# Patient Record
Sex: Female | Born: 1960 | Race: Black or African American | Hispanic: No | Marital: Married | Smoking: Never smoker
Health system: Southern US, Community
[De-identification: ages and names within clinical notes are randomized; demographics above are authoritative.]

## PROBLEM LIST (undated history)

## (undated) DIAGNOSIS — E785 Hyperlipidemia, unspecified: Secondary | ICD-10-CM

## (undated) DIAGNOSIS — M1990 Unspecified osteoarthritis, unspecified site: Secondary | ICD-10-CM

## (undated) DIAGNOSIS — R739 Hyperglycemia, unspecified: Secondary | ICD-10-CM

## (undated) DIAGNOSIS — M722 Plantar fascial fibromatosis: Secondary | ICD-10-CM

## (undated) DIAGNOSIS — I10 Essential (primary) hypertension: Secondary | ICD-10-CM

## (undated) HISTORY — PX: OTHER SURGICAL HISTORY: SHX169

## (undated) HISTORY — PX: ABDOMINAL HYSTERECTOMY: SHX81

## (undated) HISTORY — DX: Unspecified osteoarthritis, unspecified site: M19.90

## (undated) HISTORY — DX: Hyperlipidemia, unspecified: E78.5

## (undated) HISTORY — PX: ENDOMETRIAL ABLATION: SHX621

## (undated) HISTORY — PX: STOMACH SURGERY: SHX791

## (undated) HISTORY — PX: UPPER GI ENDOSCOPY: SHX6162

## (undated) HISTORY — DX: Hyperglycemia, unspecified: R73.9

## (undated) HISTORY — DX: Essential (primary) hypertension: I10

## (undated) HISTORY — DX: Plantar fascial fibromatosis: M72.2

---

## 2004-07-31 ENCOUNTER — Ambulatory Visit: Payer: Self-pay | Admitting: Unknown Physician Specialty

## 2009-09-19 ENCOUNTER — Emergency Department: Payer: Self-pay | Admitting: Emergency Medicine

## 2010-07-22 ENCOUNTER — Ambulatory Visit: Payer: Self-pay | Admitting: Unknown Physician Specialty

## 2011-07-28 ENCOUNTER — Ambulatory Visit: Payer: Self-pay | Admitting: Internal Medicine

## 2011-09-27 ENCOUNTER — Emergency Department: Payer: Self-pay | Admitting: Emergency Medicine

## 2012-01-20 ENCOUNTER — Ambulatory Visit: Payer: Self-pay | Admitting: Internal Medicine

## 2012-08-02 ENCOUNTER — Ambulatory Visit: Payer: Self-pay | Admitting: Internal Medicine

## 2012-10-10 ENCOUNTER — Ambulatory Visit: Payer: Self-pay

## 2012-11-25 ENCOUNTER — Ambulatory Visit: Payer: Self-pay | Admitting: Unknown Physician Specialty

## 2012-12-19 ENCOUNTER — Ambulatory Visit: Payer: Self-pay | Admitting: Physical Medicine and Rehabilitation

## 2013-04-11 DIAGNOSIS — M545 Low back pain, unspecified: Secondary | ICD-10-CM | POA: Insufficient documentation

## 2013-04-11 DIAGNOSIS — M533 Sacrococcygeal disorders, not elsewhere classified: Secondary | ICD-10-CM | POA: Insufficient documentation

## 2013-08-09 ENCOUNTER — Ambulatory Visit: Payer: Self-pay | Admitting: Internal Medicine

## 2013-11-17 ENCOUNTER — Ambulatory Visit: Payer: Self-pay | Admitting: Internal Medicine

## 2014-04-07 DIAGNOSIS — R739 Hyperglycemia, unspecified: Secondary | ICD-10-CM | POA: Insufficient documentation

## 2014-04-07 DIAGNOSIS — M722 Plantar fascial fibromatosis: Secondary | ICD-10-CM | POA: Insufficient documentation

## 2014-04-07 DIAGNOSIS — I10 Essential (primary) hypertension: Secondary | ICD-10-CM | POA: Insufficient documentation

## 2014-04-07 DIAGNOSIS — K279 Peptic ulcer, site unspecified, unspecified as acute or chronic, without hemorrhage or perforation: Secondary | ICD-10-CM | POA: Insufficient documentation

## 2014-04-07 DIAGNOSIS — E785 Hyperlipidemia, unspecified: Secondary | ICD-10-CM | POA: Insufficient documentation

## 2014-06-19 ENCOUNTER — Ambulatory Visit: Payer: Self-pay | Admitting: Internal Medicine

## 2014-09-13 ENCOUNTER — Ambulatory Visit: Payer: Self-pay | Admitting: Internal Medicine

## 2015-05-21 ENCOUNTER — Encounter: Payer: Self-pay | Admitting: Vascular Surgery

## 2015-05-21 ENCOUNTER — Other Ambulatory Visit: Payer: Self-pay

## 2015-05-21 DIAGNOSIS — I872 Venous insufficiency (chronic) (peripheral): Secondary | ICD-10-CM

## 2015-05-31 ENCOUNTER — Encounter: Payer: Self-pay | Admitting: Vascular Surgery

## 2015-06-03 ENCOUNTER — Ambulatory Visit (HOSPITAL_COMMUNITY)
Admission: RE | Admit: 2015-06-03 | Discharge: 2015-06-03 | Disposition: A | Discharge status: Home / Self Care | Payer: 59 | Source: Ambulatory Visit | Attending: Vascular Surgery | Admitting: Vascular Surgery

## 2015-06-03 ENCOUNTER — Ambulatory Visit (INDEPENDENT_AMBULATORY_CARE_PROVIDER_SITE_OTHER): Payer: 59 | Admitting: Vascular Surgery

## 2015-06-03 ENCOUNTER — Encounter: Payer: Self-pay | Admitting: Vascular Surgery

## 2015-06-03 VITALS — BP 137/92 | HR 76 | Temp 98.0°F | Resp 16 | Ht 66.0 in | Wt 189.0 lb

## 2015-06-03 DIAGNOSIS — M79604 Pain in right leg: Secondary | ICD-10-CM | POA: Diagnosis not present

## 2015-06-03 DIAGNOSIS — M79605 Pain in left leg: Secondary | ICD-10-CM | POA: Diagnosis not present

## 2015-06-03 DIAGNOSIS — I872 Venous insufficiency (chronic) (peripheral): Secondary | ICD-10-CM | POA: Diagnosis not present

## 2015-06-03 NOTE — Progress Notes (Signed)
Subjective:     Patient ID: Sarah Savage, female   DOB: [birth year], 54 y.o.   MRN: [id number]  HPI this 54 year old female was referred by Dr. Doy Hutching for evaluation of bilateral leg pain. Pain seems more significant on the right side and begins below the knee. This happens a lot at night when she is in bed. She will develop numbness in the toes of both feet. She has no history of trauma, DVT, thrombophlebitis, stasis ulcers, swelling, bleeding varicosities, or claudication. She is able to ambulate at least 1 mile. She states her legs feel better when she is walking. She did have surgery for a bone spur in her right heel a few years ago. She then developed some back problems which were treated eventually with physical therapy and these resolved. She has no definite history of lumbar disc disease. Symptoms of been present for about 2 years and gradually getting worse.  Past Medical History  Diagnosis Date  . Hypertension   . Hyperlipidemia   . Hyperglycemia   . Plantar fasciitis     History  Substance Use Topics  . Smoking status: Never Smoker   . Smokeless tobacco: Not on file  . Alcohol Use: No    Family History  Problem Relation Age of Onset  . Hypertension Mother   . Dementia Mother   . Hypertension Father   . Arthritis Father   . Stroke Brother   . Hypertension Brother   . Diabetes Brother   . Cancer Maternal Grandmother     Breast    Allergies  Allergen Reactions  . Penicillins     "I gives me a yeast infection"     Current outpatient prescriptions:  .  naproxen (NAPROSYN) 500 MG tablet, Take 500 mg by mouth 2 (two) times daily with a meal. , Disp: , Rfl:  .  phentermine 37.5 MG capsule, Take 37.5 mg by mouth every morning., Disp: , Rfl:  .  polyethylene glycol powder (GLYCOLAX/MIRALAX) powder, Take 0.5 Containers by mouth daily. , Disp: , Rfl:  .  propranolol ER (INDERAL LA) 80 MG 24 hr capsule, TAKE ONE CAPSULE BY MOUTH ONCE A DAY, Disp: , Rfl:  .   amLODipine (NORVASC) 2.5 MG tablet, Take 2.5 mg by mouth daily. , Disp: , Rfl:  .  fluticasone (FLONASE) 50 MCG/ACT nasal spray, Place 2 sprays into both nostrils daily. , Disp: , Rfl:  .  terbinafine (LAMISIL) 250 MG tablet, Take 250 mg by mouth daily. , Disp: , Rfl:   Filed Vitals:   06/03/15 1010  BP: 137/92  Pulse: 76  Temp: 98 F (36.7 C)  Resp: 16  Height: 5\' 6"  (1.676 m)  Weight: 189 lb (85.73 kg)  SpO2: 100%    Body mass index is 30.52 kg/(m^2).           Review of Systems denies chest pain, dyspnea on exertion, PND, orthopnea, claudication. Does have history of bone spur right heel required surgery. Also history of plantar fasciitis, hypertension and hyperlipidemia. Other systems negative and complete review of systems     Objective:   Physical Exam BP 137/92 mmHg  Pulse 76  Temp(Src) 98 F (36.7 C)  Resp 16  Ht 5\' 6"  (1.676 m)  Wt 189 lb (85.73 kg)  BMI 30.52 kg/m2  SpO2 100%  Gen.-alert and oriented x3 in no apparent distress HEENT normal for age Lungs no rhonchi or wheezing Cardiovascular regular rhythm no murmurs carotid pulses 3+ palpable no bruits audible Abdomen  soft nontender no palpable masses Musculoskeletal free of  major deformities Skin clear -no rashes Neurologic normal Lower extremities 3+ femoral and dorsalis pedis pulses palpable bilaterally with no edema  Today I ordered a venous duplex exam both legs which I reviewed and interpreted. There is no DVT, there is no deep or superficial venous reflux. It is a normal study.       Assessment:     Bilateral leg pain with numbness in the feet which occurs mainly at rest-etiology unknown No evidence of arterial or venous insufficiency and no need for further vascular workup Symptoms may be more consistent with nerve compression syndrome    Plan:     No need for further vascular workup Patient may benefit from physical therapy but I do not know the etiology of her pain and  numbness. Return to see me on when necessary basis

## 2015-06-11 ENCOUNTER — Encounter: Payer: Self-pay | Admitting: Internal Medicine

## 2015-09-05 ENCOUNTER — Encounter: Payer: Self-pay | Admitting: Obstetrics and Gynecology

## 2015-09-05 ENCOUNTER — Ambulatory Visit (INDEPENDENT_AMBULATORY_CARE_PROVIDER_SITE_OTHER): Payer: 59 | Admitting: Obstetrics and Gynecology

## 2015-09-05 VITALS — BP 122/87 | HR 72 | Wt 191.2 lb

## 2015-09-05 DIAGNOSIS — J40 Bronchitis, not specified as acute or chronic: Secondary | ICD-10-CM

## 2015-09-05 DIAGNOSIS — R87610 Atypical squamous cells of undetermined significance on cytologic smear of cervix (ASC-US): Secondary | ICD-10-CM | POA: Diagnosis not present

## 2015-09-05 MED ORDER — AZITHROMYCIN 250 MG PO TABS: ORAL_TABLET | ORAL | Status: DC

## 2015-09-05 MED ORDER — ESTROGENS, CONJUGATED 0.625 MG/GM VA CREA: 1.0000 | TOPICAL_CREAM | Freq: Every day | VAGINAL | Status: DC

## 2015-09-05 NOTE — Patient Instructions (Signed)
1.  Pap/HPV testing today. 2.  Zithromax Z-Pak prescribed for bronchitis. 3.  Return in 6 months for repeat Pap smear testing. 4.  If Pap smear is abnormal, patient will be contacted for further management planning

## 2015-09-05 NOTE — Progress Notes (Signed)
Patient ID: Sarah Savage, female   DOB: 03-14-1961, 54 y.o.   MRN: [id number]  Chief complaint: 1.  Repeat Pap. 2.  URI symptoms.  Patient presents for 6 month interval.  Colposcopy due to previous history of abnormal Pap smear' 02/05/2015- neg/pos 02/2015 colpo- ecc-  neg  6 month pap today  Patient had a sore throat, congestion and cough develop in the past week.  She is a primary care several days ago with negative evaluation.  Sore throat symptoms eased up, but chronic dry Non- productive cough Persists.  Patient denies fevers.  Past medical history, past surgical history, past problem list, medications, and allergies are reviewed.  OBJECTIVE: BP 122/87 mmHg  Pulse 72  Wt 191 lb 3.2 oz (86.728 kg) Pleasant estimated female in no acute distress. HEENT exam: Oropharynx clear; no exudates or erythema; no external ear tenderness. Neck: Supple without adenopathy or thyromegaly. Lungs: Clear. Heart: Regular rate and rhythm. Pelvic exam: External genitalia-normal BUS-normal. Vagina-white milky discharge; no lesions. Cervix-stenotic; no lesions Uterus-not examined.  IMPRESSION: 1.  History of ASCUS/positive Pap with negative colposcopy and negative ECC; repeat Pap needed today. 2.  Bronchitis.  PLAN: 1.  Pap/HPV today. 2.  Z-Pak. 3.  Return in 6 months for repeat Pap smear or sooner if abnormality is identified on today's Pap.  Alanda Slim Defrancesco  Note: This dictation was prepared with Dragon dictation along with smaller Company secretary. Any transcriptional errors that result from this process are unintentional.

## 2015-09-13 LAB — PAP IG AND HPV HIGH-RISK
HPV, high-risk: NEGATIVE
PAP Smear Comment: 0

## 2015-10-22 ENCOUNTER — Other Ambulatory Visit: Payer: Self-pay | Admitting: Internal Medicine

## 2015-10-22 DIAGNOSIS — Z1231 Encounter for screening mammogram for malignant neoplasm of breast: Secondary | ICD-10-CM

## 2015-10-23 ENCOUNTER — Ambulatory Visit
Admission: RE | Admit: 2015-10-23 | Discharge: 2015-10-23 | Disposition: A | Discharge status: Home / Self Care | Payer: 59 | Source: Ambulatory Visit | Attending: Internal Medicine | Admitting: Internal Medicine

## 2015-10-23 DIAGNOSIS — Z1231 Encounter for screening mammogram for malignant neoplasm of breast: Secondary | ICD-10-CM | POA: Insufficient documentation

## 2015-11-18 ENCOUNTER — Other Ambulatory Visit: Payer: Self-pay | Admitting: Internal Medicine

## 2015-11-18 DIAGNOSIS — M25562 Pain in left knee: Secondary | ICD-10-CM

## 2015-12-09 ENCOUNTER — Ambulatory Visit: Payer: 59

## 2016-02-11 ENCOUNTER — Ambulatory Visit (INDEPENDENT_AMBULATORY_CARE_PROVIDER_SITE_OTHER): Payer: 59 | Admitting: Obstetrics and Gynecology

## 2016-02-11 ENCOUNTER — Encounter: Payer: Self-pay | Admitting: Obstetrics and Gynecology

## 2016-02-11 VITALS — BP 105/68 | HR 73 | Ht 65.0 in | Wt 183.1 lb

## 2016-02-11 DIAGNOSIS — R638 Other symptoms and signs concerning food and fluid intake: Secondary | ICD-10-CM | POA: Insufficient documentation

## 2016-02-11 DIAGNOSIS — R87610 Atypical squamous cells of undetermined significance on cytologic smear of cervix (ASC-US): Secondary | ICD-10-CM | POA: Diagnosis not present

## 2016-02-11 DIAGNOSIS — Z0000 Encounter for general adult medical examination without abnormal findings: Secondary | ICD-10-CM | POA: Diagnosis not present

## 2016-02-11 DIAGNOSIS — Z01419 Encounter for gynecological examination (general) (routine) without abnormal findings: Secondary | ICD-10-CM

## 2016-02-11 DIAGNOSIS — N952 Postmenopausal atrophic vaginitis: Secondary | ICD-10-CM | POA: Diagnosis not present

## 2016-02-11 NOTE — Patient Instructions (Signed)
1. Pap smear is done. 2. No mammogram is needed-completed in September 2016 3. Self breast exam monthly encouraged 4. Continue with estrogen cream intravaginal twice a week 5. Stool guaiac cards are done through primary care 6. Continue healthy eating and exercise 7. Return in 1 year for physical

## 2016-02-11 NOTE — Progress Notes (Signed)
Patient ID: Sarah Savage, female   DOB: 13-Oct-1961, 55 y.o.   MRN: [id number] ANNUAL PREVENTATIVE CARE GYN  ENCOUNTER NOTE  Subjective:       Sarah Savage is a 55 y.o. No obstetric history on file. female here for a routine annual gynecologic exam.  Current complaints: 1.  Want zpac or something for congestion   Patient reports congestion-type symptoms over the past 3-4 days. No significant fevers chills or sweats. No chronic productive cough. Sore throat was present several days ago.   Gynecologic History No LMP recorded. Patient has had an ablation. Contraception: ablation Last Pap: 08/2015 neg/neg. Results were: normal Last mammogram: 10/2015. Results were: normal  Obstetric History OB History  No data available    Past Medical History  Diagnosis Date  . Hypertension   . Hyperlipidemia   . Hyperglycemia   . Plantar fasciitis     Past Surgical History  Procedure Laterality Date  . Bunionectomy    . Endometrial ablation    . Stomach surgery      excision of ulcer    Current Outpatient Prescriptions on File Prior to Visit  Medication Sig Dispense Refill  . conjugated estrogens (PREMARIN) vaginal cream Place 1 Applicatorful vaginally daily. 1/2 gram twice weekly 42.5 g 12   No current facility-administered medications on file prior to visit.    Allergies  Allergen Reactions  . Penicillins     "I gives me a yeast infection"    Social History   Social History  . Marital Status: Married    Spouse Name: N/A  . Number of Children: N/A  . Years of Education: N/A   Occupational History  . Not on file.   Social History Main Topics  . Smoking status: Never Smoker   . Smokeless tobacco: Not on file  . Alcohol Use: No  . Drug Use: No  . Sexual Activity: Not Currently   Other Topics Concern  . Not on file   Social History Narrative    Family History  Problem Relation Age of Onset  . Hypertension Mother   . Dementia Mother   .  Hypertension Father   . Arthritis Father   . Stroke Brother   . Hypertension Brother   . Diabetes Brother   . Cancer Maternal Grandmother     Breast  . Breast cancer Maternal Grandmother 60    The following portions of the patient's history were reviewed and updated as appropriate: allergies, current medications, past family history, past medical history, past social history, past surgical history and problem list.  Review of Systems ROS Review of Systems - General ROS: negative for - chills, fatigue, fever, hot flashes, night sweats, weight gain or weight loss Psychological ROS: negative for - anxiety, decreased libido, depression, mood swings, physical abuse or sexual abuse Ophthalmic ROS: negative for - blurry vision, eye pain or loss of vision ENT ROS: negative for - headaches, hearing change, visual changes or vocal changes Allergy and Immunology ROS: negative for - hives, itchy/watery eyes or seasonal allergies Hematological and Lymphatic ROS: negative for - bleeding problems, bruising, swollen lymph nodes or weight loss Endocrine ROS: negative for - galactorrhea, hair pattern changes, hot flashes, malaise/lethargy, mood swings, palpitations, polydipsia/polyuria, skin changes, temperature intolerance or unexpected weight changes Breast ROS: negative for - new or changing breast lumps or nipple discharge Respiratory ROS: negative for - cough or shortness of breath Cardiovascular ROS: negative for - chest pain, irregular heartbeat, palpitations or shortness of breath Gastrointestinal  ROS: no abdominal pain, change in bowel habits, or black or bloody stools Genito-Urinary ROS: no dysuria, trouble voiding, or hematuria Musculoskeletal ROS: negative for - joint pain or joint stiffness Neurological ROS: negative for - bowel and bladder control changes Dermatological ROS: negative for rash and skin lesion changes   Objective:   BP 105/68 mmHg  Pulse 73  Ht 5\' 5"  (1.651 m)  Wt 183 lb  1.6 oz (83.054 kg)  BMI 30.47 kg/m2 CONSTITUTIONAL: Well-developed, well-nourished female in no acute distress.  PSYCHIATRIC: Normal mood and affect. Normal behavior. Normal judgment and thought content. [place]: Alert and oriented to person, place, and time. Normal muscle tone coordination. No cranial nerve deficit noted. HENT:  Normocephalic, atraumatic, External right and left ear normal. Oropharynx is clear and moist. No exudates. No sinus tenderness EYES: Conjunctivae and EOM are normal.. No scleral icterus.  NECK: Normal range of motion, supple, no masses.  Normal thyroid. Some shotty lymph nodes submandibular noted SKIN: Skin is warm and dry. No rash noted. Not diaphoretic. No erythema. No pallor. CARDIOVASCULAR: Normal heart rate noted, regular rhythm, no murmur. RESPIRATORY: Clear to auscultation bilaterally. Effort and breath sounds normal, no problems with respiration noted. BREASTS: Symmetric in size. No masses, skin changes, nipple drainage, or lymphadenopathy. ABDOMEN: Soft, normal bowel sounds, no distention noted.  No tenderness, rebound or guarding.  BLADDER: Normal PELVIC:  External Genitalia: Normal  BUS: Normal  Vagina: Moderate atrophy  Cervix: Normal; stenotic (unable to perform Cytobrush)  Uterus: Normal; retroverted, normal size and shape  Adnexa: Normal  RV: External hemorrhoids, No Rectal Masses and Normal Sphincter tone  MUSCULOSKELETAL: Normal range of motion. No tenderness.  No cyanosis, clubbing, or edema.  2+ distal pulses. LYMPHATIC: No Axillary, Supraclavicular, or Inguinal Adenopathy.    Assessment:   Annual gynecologic examination 55 y.o. Contraception: none bmi-30 Vaginal atrophy History of abnormal Pap smear with most recent Pap smear negative/negative Viral URI with congestion  Plan:  Pap: Pap, Reflex if ASCUS Mammogram: Not Indicated Stool Guaiac Testing:  thru pcp Labs: thru pcp Routine preventative health maintenance measures  emphasized: Exercise/Diet/Weight control, Tobacco Warnings and Alcohol/Substance use risks Continue estrogen cream twice a week intravaginal Sudafed, saltwater gargles, Cepacol lozenges, Robitussin as needed Return to [place], CMA  Brayton Mars, MD  Note: This dictation was prepared with Dragon dictation along with smaller phrase technology. Any transcriptional errors that result from this process are unintentional.

## 2016-02-13 LAB — PAP IG W/ RFLX HPV ASCU: PAP SMEAR COMMENT: 0

## 2016-03-04 ENCOUNTER — Ambulatory Visit: Payer: 59 | Admitting: Obstetrics and Gynecology

## 2016-03-16 ENCOUNTER — Other Ambulatory Visit: Payer: Self-pay

## 2016-03-16 ENCOUNTER — Telehealth: Payer: Self-pay | Admitting: Obstetrics and Gynecology

## 2016-03-16 MED ORDER — ESTROGENS, CONJUGATED 0.625 MG/GM VA CREA: 1.0000 | TOPICAL_CREAM | Freq: Every day | VAGINAL | Status: DC

## 2016-03-16 MED ORDER — ESTROGENS, CONJUGATED 0.625 MG/GM VA CREA: 0.2500 | TOPICAL_CREAM | Freq: Every day | VAGINAL | Status: DC

## 2016-03-16 NOTE — Telephone Encounter (Signed)
Sarah Savage called saying she's out of vaginal cream that was prescribed to her last year and is wondering if a Rx can be sent to her pharmacy for a refill. She'd like a phone call regarding this if needed.  Pt's ph# [phone] Thank you.

## 2016-03-16 NOTE — Telephone Encounter (Signed)
Pt aware.

## 2016-09-16 ENCOUNTER — Other Ambulatory Visit: Payer: Self-pay | Admitting: Internal Medicine

## 2016-09-16 DIAGNOSIS — Z1231 Encounter for screening mammogram for malignant neoplasm of breast: Secondary | ICD-10-CM

## 2016-10-27 ENCOUNTER — Ambulatory Visit
Admission: RE | Admit: 2016-10-27 | Discharge: 2016-10-27 | Disposition: A | Discharge status: Home / Self Care | Payer: 59 | Source: Ambulatory Visit | Attending: Internal Medicine | Admitting: Internal Medicine

## 2016-10-27 DIAGNOSIS — Z1231 Encounter for screening mammogram for malignant neoplasm of breast: Secondary | ICD-10-CM

## 2017-02-08 NOTE — Progress Notes (Signed)
Patient ID: Sarah Savage, female   DOB: January 10, 1961, 56 y.o.   MRN: [id number] ANNUAL PREVENTATIVE CARE GYN  ENCOUNTER NOTE  Subjective:       Sarah Savage is a 56 y.o. G4 66 P female here for a routine annual gynecologic exam.  Current complaints: 1.  Needs refill of premarin cream - has not used recently  Patient is currently not sexually active. She does not report any significant urinary urgency or frequency. She does have nocturia 2  Patient has history of chronic constipation Patient reports no new major health issues  Gynecologic History No LMP recorded. Patient has had an ablation. Contraception: ablation Last Pap: 02/11/2016 neg/neg. Results were: normal Last mammogram: 10/27/2016 birad 1. Results were: normal  Obstetric History OB History  No data available    Past Medical History:  Diagnosis Date  . Hyperglycemia   . Hyperlipidemia   . Hypertension   . Plantar fasciitis     Past Surgical History:  Procedure Laterality Date  . BUNIONECTOMY    . ENDOMETRIAL ABLATION    . STOMACH SURGERY     excision of ulcer    Current Outpatient Prescriptions on File Prior to Visit  Medication Sig Dispense Refill  . conjugated estrogens (PREMARIN) vaginal cream Place 1 Applicatorful vaginally daily. 1/2 gram twice weekly 42.5 g 12   No current facility-administered medications on file prior to visit.     Allergies  Allergen Reactions  . Penicillins     "I gives me a yeast infection"    Social History   Social History  . Marital status: Married    Spouse name: N/A  . Number of children: N/A  . Years of education: N/A   Occupational History  . Not on file.   Social History Main Topics  . Smoking status: Never Smoker  . Smokeless tobacco: Not on file  . Alcohol use No  . Drug use: No  . Sexual activity: Not Currently   Other Topics Concern  . Not on file   Social History Narrative  . No narrative on file    Family History  Problem  Relation Age of Onset  . Hypertension Mother   . Dementia Mother   . Hypertension Father   . Arthritis Father   . Stroke Brother   . Hypertension Brother   . Diabetes Brother   . Cancer Maternal Grandmother     Breast  . Breast cancer Maternal Grandmother 60    The following portions of the patient's history were reviewed and updated as appropriate: allergies, current medications, past family history, past medical history, past social history, past surgical history and problem list.  Review of Systems Review of Systems  Constitutional: Negative.   HENT: Negative.   Eyes: Negative.   Respiratory: Negative.   Cardiovascular: Negative.   Gastrointestinal: Positive for constipation.  Genitourinary:       Nocturia 2  Musculoskeletal: Negative.   Skin: Negative.   Neurological: Negative.   Endo/Heme/Allergies: Negative.   Psychiatric/Behavioral: Negative.     Objective:   BP 117/79   Pulse 93   Ht 5\' 5"  (1.651 m)   Wt 169 lb (76.7 kg)   BMI 28.12 kg/m  CONSTITUTIONAL: Well-developed, well-nourished female in no acute distress.  PSYCHIATRIC: Normal mood and affect. Normal behavior. Normal judgment and thought content. [place]: Alert and oriented to person, place, and time. Normal muscle tone coordination. No cranial nerve deficit noted. HENT:  Normocephalic, atraumatic, External right and left ear normal.  Oropharynx is clear and moist.  EYES: Conjunctivae and EOM are normal.. No scleral icterus.  NECK: Normal range of motion, supple, no masses.  Normal thyroid.  SKIN: Skin is warm and dry. No rash noted. Not diaphoretic. No erythema. No pallor. CARDIOVASCULAR: Normal heart rate noted, regular rhythm, no murmur. RESPIRATORY: Clear to auscultation bilaterally. Effort and breath sounds normal, no problems with respiration noted. BREASTS: Symmetric in size. No masses, skin changes, nipple drainage, or lymphadenopathy. ABDOMEN: Soft, normal bowel sounds, no distention noted.   No tenderness, rebound or guarding.  BLADDER: Normal PELVIC:  External Genitalia: Normal  BUS: Normal  Vagina: Moderate atrophy; Single-tooth exam performed  Cervix: Normal; no lesions; no cervical motion tenderness  Uterus: Normal; retroverted, normal size and shape  Adnexa: Normal  RV: External hemorrhoids, No Rectal Masses and Normal Sphincter tone abundant firm stool in the rectal vault noted MUSCULOSKELETAL: Normal range of motion. No tenderness.  No cyanosis, clubbing, or edema.  2+ distal pulses. LYMPHATIC: No Axillary, Supraclavicular, or Inguinal Adenopathy.    Assessment:   Annual gynecologic examination 56 y.o. Contraception: none bmi-28 Vaginal atrophy, Minimally symptomatic History of abnormal Pap smear with most recent Pap smear negative/negative Chronic constipation  Plan:  Pap: Due 2020 Mammogram: utd Stool Guaiac Testing:  thru pcp Labs: thru pcp Routine preventative health maintenance measures emphasized: Exercise/Diet/Weight control, Tobacco Warnings and Alcohol/Substance use risks Continue estrogen cream twice a week intravaginal Encouraged stool softener once or twice daily; increase water intake Return to [place], CMA  Brayton Mars, MD   Note: This dictation was prepared with Dragon dictation along with smaller phrase technology. Any transcriptional errors that result from this process are unintentional.

## 2017-02-11 ENCOUNTER — Ambulatory Visit (INDEPENDENT_AMBULATORY_CARE_PROVIDER_SITE_OTHER): Payer: 59 | Admitting: Obstetrics and Gynecology

## 2017-02-11 ENCOUNTER — Encounter: Payer: Self-pay | Admitting: Obstetrics and Gynecology

## 2017-02-11 VITALS — BP 117/79 | HR 93 | Ht 65.0 in | Wt 169.0 lb

## 2017-02-11 DIAGNOSIS — N952 Postmenopausal atrophic vaginitis: Secondary | ICD-10-CM | POA: Diagnosis not present

## 2017-02-11 DIAGNOSIS — R638 Other symptoms and signs concerning food and fluid intake: Secondary | ICD-10-CM | POA: Diagnosis not present

## 2017-02-11 DIAGNOSIS — Z01419 Encounter for gynecological examination (general) (routine) without abnormal findings: Secondary | ICD-10-CM

## 2017-02-11 MED ORDER — ESTROGENS, CONJUGATED 0.625 MG/GM VA CREA: 0.2500 | TOPICAL_CREAM | VAGINAL | 12 refills | Status: DC

## 2017-02-11 NOTE — Patient Instructions (Signed)
1. No Pap smear is needed until 2020 2. Mammogram already obtained this year 3. Stool guaiac card testing for colon cancer is done primary care 4. Screening labs are done through primary care 5. Recommend continue healthy eating and exercise 6. Premarin cream intravaginally once or twice a week is reordered 7. Return in 1 year for annual exam  Health Maintenance for Postmenopausal Women Menopause is a normal process in which your reproductive ability comes to an end. This process happens gradually over a span of months to years, usually between the ages of 45 and 27. Menopause is complete when you have missed 12 consecutive menstrual periods. It is important to talk with your health care provider about some of the most common conditions that affect postmenopausal women, such as heart disease, cancer, and bone loss (osteoporosis). Adopting a healthy lifestyle and getting preventive care can help to promote your health and wellness. Those actions can also lower your chances of developing some of these common conditions. What should I know about menopause? During menopause, you may experience a number of symptoms, such as:  Moderate-to-severe hot flashes.  Night sweats.  Decrease in sex drive.  Mood swings.  Headaches.  Tiredness.  Irritability.  Memory problems.  Insomnia. Choosing to treat or not to treat menopausal changes is an individual decision that you make with your health care provider. What should I know about hormone replacement therapy and supplements? Hormone therapy products are effective for treating symptoms that are associated with menopause, such as hot flashes and night sweats. Hormone replacement carries certain risks, especially as you become older. If you are thinking about using estrogen or estrogen with progestin treatments, discuss the benefits and risks with your health care provider. What should I know about heart disease and stroke? Heart disease, heart  attack, and stroke become more likely as you age. This may be due, in part, to the hormonal changes that your body experiences during menopause. These can affect how your body processes dietary fats, triglycerides, and cholesterol. Heart attack and stroke are both medical emergencies. There are many things that you can do to help prevent heart disease and stroke:  Have your blood pressure checked at least every 1-2 years. High blood pressure causes heart disease and increases the risk of stroke.  If you are 17-74 years old, ask your health care provider if you should take aspirin to prevent a heart attack or a stroke.  Do not use any tobacco products, including cigarettes, chewing tobacco, or electronic cigarettes. If you need help quitting, ask your health care provider.  It is important to eat a healthy diet and maintain a healthy weight.  Be sure to include plenty of vegetables, fruits, low-fat dairy products, and lean protein.  Avoid eating foods that are high in solid fats, added sugars, or salt (sodium).  Get regular exercise. This is one of the most important things that you can do for your health.  Try to exercise for at least 150 minutes each week. The type of exercise that you do should increase your heart rate and make you sweat. This is known as moderate-intensity exercise.  Try to do strengthening exercises at least twice each week. Do these in addition to the moderate-intensity exercise.  Know your numbers.Ask your health care provider to check your cholesterol and your blood glucose. Continue to have your blood tested as directed by your health care provider. What should I know about cancer screening? There are several types of cancer. Take the  following steps to reduce your risk and to catch any cancer development as early as possible. Breast Cancer  Practice breast self-awareness.  This means understanding how your breasts normally appear and feel.  It also means doing  regular breast self-exams. Let your health care provider know about any changes, no matter how small.  If you are 38 or older, have a clinician do a breast exam (clinical breast exam or CBE) every year. Depending on your age, family history, and medical history, it may be recommended that you also have a yearly breast X-ray (mammogram).  If you have a family history of breast cancer, talk with your health care provider about genetic screening.  If you are at high risk for breast cancer, talk with your health care provider about having an MRI and a mammogram every year.  Breast cancer (BRCA) gene test is recommended for women who have family members with BRCA-related cancers. Results of the assessment will determine the need for genetic counseling and BRCA1 and for BRCA2 testing. BRCA-related cancers include these types:  Breast. This occurs in males or females.  Ovarian.  Tubal. This may also be called fallopian tube cancer.  Cancer of the abdominal or pelvic lining (peritoneal cancer).  Prostate.  Pancreatic. Cervical, Uterine, and Ovarian Cancer  Your health care provider may recommend that you be screened regularly for cancer of the pelvic organs. These include your ovaries, uterus, and vagina. This screening involves a pelvic exam, which includes checking for microscopic changes to the surface of your cervix (Pap test).  For women ages 21-65, health care providers may recommend a pelvic exam and a Pap test every three years. For women ages 49-65, they may recommend the Pap test and pelvic exam, combined with testing for human papilloma virus (HPV), every five years. Some types of HPV increase your risk of cervical cancer. Testing for HPV may also be done on women of any age who have unclear Pap test results.  Other health care providers may not recommend any screening for nonpregnant women who are considered low risk for pelvic cancer and have no symptoms. Ask your health care provider  if a screening pelvic exam is right for you.  If you have had past treatment for cervical cancer or a condition that could lead to cancer, you need Pap tests and screening for cancer for at least 20 years after your treatment. If Pap tests have been discontinued for you, your risk factors (such as having a new sexual partner) need to be reassessed to determine if you should start having screenings again. Some women have medical problems that increase the chance of getting cervical cancer. In these cases, your health care provider may recommend that you have screening and Pap tests more often.  If you have a family history of uterine cancer or ovarian cancer, talk with your health care provider about genetic screening.  If you have vaginal bleeding after reaching menopause, tell your health care provider.  There are currently no reliable tests available to screen for ovarian cancer. Lung Cancer  Lung cancer screening is recommended for adults 21-26 years old who are at high risk for lung cancer because of a history of smoking. A yearly low-dose CT scan of the lungs is recommended if you:  Currently smoke.  Have a history of at least 30 pack-years of smoking and you currently smoke or have quit within the past 15 years. A pack-year is smoking an average of one pack of cigarettes per day  for one year. Yearly screening should:  Continue until it has been 15 years since you quit.  Stop if you develop a health problem that would prevent you from having lung cancer treatment. Colorectal Cancer  This type of cancer can be detected and can often be prevented.  Routine colorectal cancer screening usually begins at age 54 and continues through age 74.  If you have risk factors for colon cancer, your health care provider may recommend that you be screened at an earlier age.  If you have a family history of colorectal cancer, talk with your health care provider about genetic screening.  Your health  care provider may also recommend using home test kits to check for hidden blood in your stool.  A small camera at the end of a tube can be used to examine your colon directly (sigmoidoscopy or colonoscopy). This is done to check for the earliest forms of colorectal cancer.  Direct examination of the colon should be repeated every 5-10 years until age 54. However, if early forms of precancerous polyps or small growths are found or if you have a family history or genetic risk for colorectal cancer, you may need to be screened more often. Skin Cancer  Check your skin from head to toe regularly.  Monitor any moles. Be sure to tell your health care provider:  About any new moles or changes in moles, especially if there is a change in a mole's shape or color.  If you have a mole that is larger than the size of a pencil eraser.  If any of your family members has a history of skin cancer, especially at a young age, talk with your health care provider about genetic screening.  Always use sunscreen. Apply sunscreen liberally and repeatedly throughout the day.  Whenever you are outside, protect yourself by wearing Mancusi sleeves, pants, a wide-brimmed hat, and sunglasses. What should I know about osteoporosis? Osteoporosis is a condition in which bone destruction happens more quickly than new bone creation. After menopause, you may be at an increased risk for osteoporosis. To help prevent osteoporosis or the bone fractures that can happen because of osteoporosis, the following is recommended:  If you are 42-50 years old, get at least 1,000 mg of calcium and at least 600 mg of vitamin D per day.  If you are older than age 77 but younger than age 44, get at least 1,200 mg of calcium and at least 600 mg of vitamin D per day.  If you are older than age 50, get at least 1,200 mg of calcium and at least 800 mg of vitamin D per day. Smoking and excessive alcohol intake increase the risk of osteoporosis. Eat  foods that are rich in calcium and vitamin D, and do weight-bearing exercises several times each week as directed by your health care provider. What should I know about how menopause affects my mental health? Depression may occur at any age, but it is more common as you become older. Common symptoms of depression include:  Low or sad mood.  Changes in sleep patterns.  Changes in appetite or eating patterns.  Feeling an overall lack of motivation or enjoyment of activities that you previously enjoyed.  Frequent crying spells. Talk with your health care provider if you think that you are experiencing depression. What should I know about immunizations? It is important that you get and maintain your immunizations. These include:  Tetanus, diphtheria, and pertussis (Tdap) booster vaccine.  Influenza every year  before the flu season begins.  Pneumonia vaccine.  Shingles vaccine. Your health care provider may also recommend other immunizations. This information is not intended to replace advice given to you by your health care provider. Make sure you discuss any questions you have with your health care provider. Document Released: 12/04/2005 Document Revised: 05/01/2016 Document Reviewed: 07/16/2015 Elsevier Interactive Patient Education  2017 [employer].

## 2017-02-15 DIAGNOSIS — J069 Acute upper respiratory infection, unspecified: Secondary | ICD-10-CM | POA: Diagnosis not present

## 2017-02-15 DIAGNOSIS — R21 Rash and other nonspecific skin eruption: Secondary | ICD-10-CM | POA: Diagnosis not present

## 2017-04-08 DIAGNOSIS — S80862A Insect bite (nonvenomous), left lower leg, initial encounter: Secondary | ICD-10-CM | POA: Diagnosis not present

## 2017-04-08 DIAGNOSIS — T63441A Toxic effect of venom of bees, accidental (unintentional), initial encounter: Secondary | ICD-10-CM | POA: Diagnosis not present

## 2017-04-08 DIAGNOSIS — L03116 Cellulitis of left lower limb: Secondary | ICD-10-CM | POA: Diagnosis not present

## 2017-05-05 DIAGNOSIS — H6123 Impacted cerumen, bilateral: Secondary | ICD-10-CM | POA: Diagnosis not present

## 2017-05-05 DIAGNOSIS — H902 Conductive hearing loss, unspecified: Secondary | ICD-10-CM | POA: Diagnosis not present

## 2017-06-23 DIAGNOSIS — L819 Disorder of pigmentation, unspecified: Secondary | ICD-10-CM | POA: Diagnosis not present

## 2017-07-26 DIAGNOSIS — M25562 Pain in left knee: Secondary | ICD-10-CM | POA: Diagnosis not present

## 2017-07-26 DIAGNOSIS — R7309 Other abnormal glucose: Secondary | ICD-10-CM | POA: Diagnosis not present

## 2017-07-26 DIAGNOSIS — Z79899 Other long term (current) drug therapy: Secondary | ICD-10-CM | POA: Diagnosis not present

## 2017-08-04 DIAGNOSIS — L2089 Other atopic dermatitis: Secondary | ICD-10-CM | POA: Diagnosis not present

## 2017-08-09 DIAGNOSIS — S838X2D Sprain of other specified parts of left knee, subsequent encounter: Secondary | ICD-10-CM | POA: Diagnosis not present

## 2017-08-09 DIAGNOSIS — M25562 Pain in left knee: Secondary | ICD-10-CM | POA: Diagnosis not present

## 2017-08-25 DIAGNOSIS — J3489 Other specified disorders of nose and nasal sinuses: Secondary | ICD-10-CM | POA: Diagnosis not present

## 2017-08-25 DIAGNOSIS — J342 Deviated nasal septum: Secondary | ICD-10-CM | POA: Diagnosis not present

## 2017-08-25 DIAGNOSIS — H6123 Impacted cerumen, bilateral: Secondary | ICD-10-CM | POA: Diagnosis not present

## 2017-09-28 ENCOUNTER — Ambulatory Visit: Payer: 59 | Admitting: Obstetrics and Gynecology

## 2017-09-28 ENCOUNTER — Encounter: Payer: Self-pay | Admitting: Obstetrics and Gynecology

## 2017-09-28 VITALS — BP 137/84 | HR 72 | Ht 65.0 in | Wt 180.8 lb

## 2017-09-28 DIAGNOSIS — N760 Acute vaginitis: Secondary | ICD-10-CM

## 2017-09-28 DIAGNOSIS — N898 Other specified noninflammatory disorders of vagina: Secondary | ICD-10-CM

## 2017-09-28 DIAGNOSIS — B9689 Other specified bacterial agents as the cause of diseases classified elsewhere: Secondary | ICD-10-CM | POA: Diagnosis not present

## 2017-09-28 MED ORDER — METRONIDAZOLE 0.75 % VA GEL: 1.0000 | Freq: Every day | VAGINAL | 1 refills | Status: DC

## 2017-09-28 MED ORDER — ESTROGENS, CONJUGATED 0.625 MG/GM VA CREA: 0.2500 | TOPICAL_CREAM | VAGINAL | 12 refills | Status: DC

## 2017-09-28 NOTE — Patient Instructions (Signed)
1.  MetroGel-1 applicator daily times 5 days Bacterial Vaginosis Bacterial vaginosis is a vaginal infection that occurs when the normal balance of bacteria in the vagina is disrupted. It results from an overgrowth of certain bacteria. This is the most common vaginal infection among women ages 44-44. Because bacterial vaginosis increases your risk for STIs (sexually transmitted infections), getting treated can help reduce your risk for chlamydia, gonorrhea, herpes, and HIV (human immunodeficiency virus). Treatment is also important for preventing complications in pregnant women, because this condition can cause an early (premature) delivery. What are the causes? This condition is caused by an increase in harmful bacteria that are normally present in small amounts in the vagina. However, the reason that the condition develops is not fully understood. What increases the risk? The following factors may make you more likely to develop this condition:  Having a new sexual partner or multiple sexual partners.  Having unprotected sex.  Douching.  Having an intrauterine device (IUD).  Smoking.  Drug and alcohol abuse.  Taking certain antibiotic medicines.  Being pregnant.  You cannot get bacterial vaginosis from toilet seats, bedding, swimming pools, or contact with objects around you. What are the signs or symptoms? Symptoms of this condition include:  Grey or white vaginal discharge. The discharge can also be watery or foamy.  A fish-like odor with discharge, especially after sexual intercourse or during menstruation.  Itching in and around the vagina.  Burning or pain with urination.  Some women with bacterial vaginosis have no signs or symptoms. How is this diagnosed? This condition is diagnosed based on:  Your medical history.  A physical exam of the vagina.  Testing a sample of vaginal fluid under a microscope to look for a large amount of bad bacteria or abnormal cells.  Your health care provider may use a cotton swab or a small wooden spatula to collect the sample.  How is this treated? This condition is treated with antibiotics. These may be given as a pill, a vaginal cream, or a medicine that is put into the vagina (suppository). If the condition comes back after treatment, a second round of antibiotics may be needed. Follow these instructions at home: Medicines  Take over-the-counter and prescription medicines only as told by your health care provider.  Take or use your antibiotic as told by your health care provider. Do not stop taking or using the antibiotic even if you start to feel better. General instructions  If you have a female sexual partner, tell her that you have a vaginal infection. She should see her health care provider and be treated if she has symptoms. If you have a female sexual partner, he does not need treatment.  During treatment: ? Avoid sexual activity until you finish treatment. ? Do not douche. ? Avoid alcohol as directed by your health care provider. ? Avoid breastfeeding as directed by your health care provider.  Drink enough water and fluids to keep your urine clear or pale yellow.  Keep the area around your vagina and rectum clean. ? Wash the area daily with warm water. ? Wipe yourself from front to back after using the toilet.  Keep all follow-up visits as told by your health care provider. This is important. How is this prevented?  Do not douche.  Wash the outside of your vagina with warm water only.  Use protection when having sex. This includes latex condoms and dental dams.  Limit how many sexual partners you have. To help prevent bacterial vaginosis,  it is best to have sex with just one partner (monogamous).  Make sure you and your sexual partner are tested for STIs.  Wear cotton or cotton-lined underwear.  Avoid wearing tight pants and pantyhose, especially during summer.  Limit the amount of alcohol  that you drink.  Do not use any products that contain nicotine or tobacco, such as cigarettes and e-cigarettes. If you need help quitting, ask your health care provider.  Do not use illegal drugs. Where to find more information:  Centers for Disease Control and Prevention: AppraiserFraud.fi  American Sexual Health Association (ASHA): www.ashastd.org  [place] Department of Health and Financial controller, Office on Women's Health: DustingSprays.pl or SecuritiesCard.it Contact a health care provider if:  Your symptoms do not improve, even after treatment.  You have more discharge or pain when urinating.  You have a fever.  You have pain in your abdomen.  You have pain during sex.  You have vaginal bleeding between periods. Summary  Bacterial vaginosis is a vaginal infection that occurs when the normal balance of bacteria in the vagina is disrupted.  Because bacterial vaginosis increases your risk for STIs (sexually transmitted infections), getting treated can help reduce your risk for chlamydia, gonorrhea, herpes, and HIV (human immunodeficiency virus). Treatment is also important for preventing complications in pregnant women, because the condition can cause an early (premature) delivery.  This condition is treated with antibiotic medicines. These may be given as a pill, a vaginal cream, or a medicine that is put into the vagina (suppository). This information is not intended to replace advice given to you by your health care provider. Make sure you discuss any questions you have with your health care provider. Document Released: 10/12/2005 Document Revised: 06/27/2016 Document Reviewed: 06/27/2016 Elsevier Interactive Patient Education  2017 [employer].

## 2017-09-28 NOTE — Addendum Note (Signed)
Addended by: Elouise Munroe on: 09/28/2017 02:07 PM   Modules accepted: Orders

## 2017-09-28 NOTE — Progress Notes (Signed)
Chief complaint: 1.  Vaginal discharge  Patient presents for evaluation of vaginal discharge.  Patient reports no significant odor, itching, or burning.  No recent new sex partners.   Patient has been treated with 2 rounds of antibiotics for upper respiratory infection  Past medical history: Past surgical history, problem list, medications, and allergies are reviewed  OBJECTIVE: BP 137/84   Pulse 72   Ht 5\' 5"  (1.651 m)   Wt 180 lb 12.8 oz (82 kg)   BMI 30.09 kg/m  Pleasant well-appearing female no acute distress.  Alert and oriented. Pelvic exam: External genitalia-normal BUS-normal Vagina-white secretions in vaginal vault Bimanual-not performed  PROCEDURES: Wet prep  KOH-no hyphae  Normal saline-clue cells present; no significant white blood cells; no trichomonas  ASSESSMENT: 1.  Vaginal discharge 2.  Bacterial vaginosis  PLAN: 1.  Wet prep/Nuswab obtained as noted 2.  MetroGel daily times 5 days intravaginal 3.  Follow-up as needed 4.  Education regarding bacterial vaginosis given  A total of 15 minutes were spent face-to-face with the patient during this encounter and over half of that time dealt with counseling and coordination of care.  Brayton Mars, MD  Note: This dictation was prepared with Dragon dictation along with smaller phrase technology. Any transcriptional errors that result from this process are unintentional.

## 2017-09-30 LAB — NUSWAB BV AND CANDIDA, NAA
Atopobium vaginae: HIGH Score — AB
CANDIDA ALBICANS, NAA: NEGATIVE
CANDIDA GLABRATA, NAA: NEGATIVE
MEGASPHAERA 1: HIGH {score} — AB

## 2017-10-17 ENCOUNTER — Encounter (HOSPITAL_COMMUNITY): Payer: Self-pay | Admitting: Emergency Medicine

## 2017-10-17 ENCOUNTER — Ambulatory Visit (INDEPENDENT_AMBULATORY_CARE_PROVIDER_SITE_OTHER): Payer: 59

## 2017-10-17 ENCOUNTER — Ambulatory Visit (HOSPITAL_COMMUNITY)
Admission: EM | Admit: 2017-10-17 | Discharge: 2017-10-17 | Disposition: A | Discharge status: Home / Self Care | Payer: 59 | Attending: Family Medicine | Admitting: Family Medicine

## 2017-10-17 DIAGNOSIS — M79602 Pain in left arm: Secondary | ICD-10-CM

## 2017-10-17 DIAGNOSIS — R202 Paresthesia of skin: Secondary | ICD-10-CM | POA: Diagnosis not present

## 2017-10-17 DIAGNOSIS — S199XXA Unspecified injury of neck, initial encounter: Secondary | ICD-10-CM | POA: Diagnosis not present

## 2017-10-17 MED ORDER — DICLOFENAC SODIUM 75 MG PO TBEC: 75.0000 mg | DELAYED_RELEASE_TABLET | Freq: Two times a day (BID) | ORAL | 0 refills | Status: DC

## 2017-10-17 NOTE — Discharge Instructions (Signed)
The x-rays show no sign of a swollen or ruptured disc.   I suspect you have a bruised nerve.  This should gradually get better in 10 days and the medicine prescribed will help.

## 2017-10-17 NOTE — ED Triage Notes (Signed)
PT C/O: pt reports she slipped on wet floor at a restaurant and inj her left arm associated w/numbness of left hand .... EMS on site   ONSET: 2 days   DENIES: head inj/LOC  TAKING MEDS: acetaminophen   A&O x4... NAD... Ambulatory

## 2017-10-17 NOTE — ED Provider Notes (Signed)
[facility]   [id number] 10/17/17 Arrival Time: 1839   SUBJECTIVE:  Sarah Savage is a 56 y.o. female who presents to the urgent care with complaint of left arm and hand pain and numbness after slipping on wet floor two days ago.  The aching is in the posterior humerus area and the numbness is in the volar left index finger.  Able to move neck without pain but there has been some left sided aching in the lower left neck.   Past Medical History:  Diagnosis Date  . Hyperglycemia   . Hyperlipidemia   . Hypertension   . Plantar fasciitis    Family History  Problem Relation Age of Onset  . Hypertension Mother   . Dementia Mother   . Hypertension Father   . Arthritis Father   . Stroke Brother   . Hypertension Brother   . Diabetes Brother   . Cancer Maternal Grandmother        Breast  . Breast cancer Maternal Grandmother 60   Social History   Socioeconomic History  . Marital status: Married    Spouse name: Not on file  . Number of children: Not on file  . Years of education: Not on file  . Highest education level: Not on file  Social Needs  . Financial resource strain: Not on file  . Food insecurity - worry: Not on file  . Food insecurity - inability: Not on file  . Transportation needs - medical: Not on file  . Transportation needs - non-medical: Not on file  Occupational History  . Not on file  Tobacco Use  . Smoking status: Never Smoker  . Smokeless tobacco: Never Used  Substance and Sexual Activity  . Alcohol use: Yes    Alcohol/week: 0.0 oz    Comment: occas  . Drug use: No  . Sexual activity: Not Currently  Other Topics Concern  . Not on file  Social History Narrative  . Not on file   No outpatient medications have been marked as taking for the 10/17/17 encounter [facility] Encounter).   Allergies  Allergen Reactions  . Penicillins     "I gives me a yeast infection"      ROS: As per HPI, remainder of ROS  negative.   OBJECTIVE:   Vitals:   10/17/17 1912  BP: 131/90  Pulse: 77  Resp: 20  Temp: 98.7 F (37.1 C)  TempSrc: Oral  SpO2: 100%     General appearance: alert; no distress Eyes: PERRL; EOMI; conjunctiva normal HENT: normocephalic; atraumatic; Neck: supple with no localized tenderness Back: no CVA tenderness Extremities: no cyanosis or edema; symmetrical with no gross deformities Skin: warm and dry Neurologic: normal gait; grossly normal; FROM neck and shoulder. Psychological: alert and cooperative; normal mood and affect      Labs:  Results for orders placed or performed in visit on 09/28/17  NuSwab BV and Candida, NAA  Result Value Ref Range   Atopobium vaginae High - 2 (A) Score   BVAB 2 Moderate - 1 Score   Megasphaera 1 High - 2 (A) Score   Candida albicans, NAA Negative Negative   Candida glabrata, NAA Negative Negative    Labs Reviewed - No data to display  No results found.     ASSESSMENT & PLAN:  1. Left arm pain     Meds ordered this encounter  Medications  . diclofenac (VOLTAREN) 75 MG EC tablet    Sig: Take 1 tablet (75 mg total)  by mouth 2 (two) times daily.    Dispense:  14 tablet    Refill:  0    Reviewed expectations re: course of current medical issues. Questions answered. Outlined signs and symptoms indicating need for more acute intervention. Patient verbalized understanding. After Visit Summary given.    Procedures:      Robyn Haber, MD 10/17/17 Karl Bales

## 2017-10-21 DIAGNOSIS — M79602 Pain in left arm: Secondary | ICD-10-CM | POA: Diagnosis not present

## 2017-10-21 DIAGNOSIS — R202 Paresthesia of skin: Secondary | ICD-10-CM | POA: Diagnosis not present

## 2017-10-21 DIAGNOSIS — M542 Cervicalgia: Secondary | ICD-10-CM | POA: Diagnosis not present

## 2017-10-21 DIAGNOSIS — M19012 Primary osteoarthritis, left shoulder: Secondary | ICD-10-CM | POA: Diagnosis not present

## 2017-11-01 ENCOUNTER — Telehealth: Payer: Self-pay | Admitting: *Deleted

## 2017-11-01 MED ORDER — METRONIDAZOLE 0.75 % VA GEL: Freq: Every day | VAGINAL | 0 refills | Status: DC

## 2017-11-01 NOTE — Telephone Encounter (Signed)
Patient came in office and states she seen Dr. Tennis Must for a vaginal infection. Patient states he prescribed her a gel. She states she is experiencing some vaginal discharge. Patient is wondering if she needs another appt or if he can prescribe her another gerl. Patient is requesting a call back. Her contact # is [phone]

## 2017-11-01 NOTE — Telephone Encounter (Signed)
Pt aware per vm metrogel erx. If still having sx after 7 days she will need to be seen.

## 2017-11-02 ENCOUNTER — Other Ambulatory Visit: Payer: Self-pay | Admitting: Internal Medicine

## 2017-11-02 DIAGNOSIS — Z1231 Encounter for screening mammogram for malignant neoplasm of breast: Secondary | ICD-10-CM

## 2017-11-04 DIAGNOSIS — J060 Acute laryngopharyngitis: Secondary | ICD-10-CM | POA: Diagnosis not present

## 2017-11-04 DIAGNOSIS — J019 Acute sinusitis, unspecified: Secondary | ICD-10-CM | POA: Diagnosis not present

## 2017-11-04 DIAGNOSIS — H6123 Impacted cerumen, bilateral: Secondary | ICD-10-CM | POA: Diagnosis not present

## 2017-11-16 ENCOUNTER — Ambulatory Visit: Payer: 59 | Admitting: Obstetrics and Gynecology

## 2017-11-16 ENCOUNTER — Encounter: Payer: Self-pay | Admitting: Obstetrics and Gynecology

## 2017-11-16 VITALS — BP 163/104 | HR 82 | Ht 65.0 in | Wt 182.0 lb

## 2017-11-16 DIAGNOSIS — E7800 Pure hypercholesterolemia, unspecified: Secondary | ICD-10-CM | POA: Diagnosis not present

## 2017-11-16 DIAGNOSIS — Z79899 Other long term (current) drug therapy: Secondary | ICD-10-CM | POA: Diagnosis not present

## 2017-11-16 DIAGNOSIS — A5901 Trichomonal vulvovaginitis: Secondary | ICD-10-CM | POA: Insufficient documentation

## 2017-11-16 DIAGNOSIS — R7309 Other abnormal glucose: Secondary | ICD-10-CM | POA: Diagnosis not present

## 2017-11-16 MED ORDER — METRONIDAZOLE 500 MG PO TABS: 500.0000 mg | ORAL_TABLET | Freq: Two times a day (BID) | ORAL | 0 refills | Status: DC

## 2017-11-16 NOTE — Progress Notes (Signed)
Chief complaint: 1.  Recurrent bacterial vaginosis  57 year old Afro-American female menopausal, on Premarin cream vaginally for atrophy, presents for evaluation of recurrent vaginitis.  She states that she is continuing to experience an atypical vaginal discharge.  She reports no significant itching or burning.  She has been treated for bacterial vaginosis with MetroGel in 09/28/2017; she was retreated on 11/01/2017.  Symptoms have recurred.  She is not having any new sexual partners.  Past Medical History:  Diagnosis Date  . Hyperglycemia   . Hyperlipidemia   . Hypertension   . Plantar fasciitis    Past Surgical History:  Procedure Laterality Date  . BUNIONECTOMY    . ENDOMETRIAL ABLATION    . STOMACH SURGERY     excision of ulcer   OBJECTIVE: BP (!) 163/104   Pulse 82   Ht 5\' 5"  (1.651 m)   Wt 182 lb (82.6 kg)   BMI 30.29 kg/m  Pleasant female in no acute distress.  Alert and oriented. Back: No CVA tenderness Abdomen: Soft, nontender without organomegaly Pelvic exam: External genitalia-normal BUS-normal Vagina-gray white secretions in vaginal vault; cervix without lesions or significant discharge  PROCEDURE: Wet prep Normal saline-TNTC white blood cells; no clue cells; trichomonas present KOH-no hyphae seen  ASSESSMENT: 1.  Trichomonas vaginitis  PLAN: 1.  Metronidazole 500 mg twice a day for 7 days 2.  Patient instructed on avoiding intercourse until partner is treated simultaneously 3.  Follow-up as needed.  A total of 15 minutes were spent face-to-face with the patient during this encounter and over half of that time dealt with counseling and coordination of care.  Brayton Mars, MD  Note: This dictation was prepared with Dragon dictation along with smaller phrase technology. Any transcriptional errors that result from this process are unintentional.

## 2017-11-16 NOTE — Patient Instructions (Signed)
1.  Take metronidazole 500 mg twice a day for 7 days for treatment of trichomonas vaginalis 2.  Partner needs to be treated in order to prevent recurrence of this infection.  Have him present to his primary care physician for metronidazole therapy. 3.  Follow-up as needed

## 2017-11-19 ENCOUNTER — Ambulatory Visit
Admission: RE | Admit: 2017-11-19 | Discharge: 2017-11-19 | Disposition: A | Discharge status: Home / Self Care | Payer: 59 | Source: Ambulatory Visit | Attending: Internal Medicine | Admitting: Internal Medicine

## 2017-11-19 DIAGNOSIS — Z1231 Encounter for screening mammogram for malignant neoplasm of breast: Secondary | ICD-10-CM

## 2017-11-23 DIAGNOSIS — I10 Essential (primary) hypertension: Secondary | ICD-10-CM | POA: Diagnosis not present

## 2017-11-23 DIAGNOSIS — R739 Hyperglycemia, unspecified: Secondary | ICD-10-CM | POA: Diagnosis not present

## 2017-11-23 DIAGNOSIS — Z0000 Encounter for general adult medical examination without abnormal findings: Secondary | ICD-10-CM | POA: Diagnosis not present

## 2017-12-09 DIAGNOSIS — M25562 Pain in left knee: Secondary | ICD-10-CM | POA: Diagnosis not present

## 2017-12-21 DIAGNOSIS — M25562 Pain in left knee: Secondary | ICD-10-CM | POA: Diagnosis not present

## 2017-12-28 DIAGNOSIS — M25562 Pain in left knee: Secondary | ICD-10-CM | POA: Diagnosis not present

## 2017-12-31 DIAGNOSIS — R739 Hyperglycemia, unspecified: Secondary | ICD-10-CM | POA: Diagnosis not present

## 2017-12-31 DIAGNOSIS — I10 Essential (primary) hypertension: Secondary | ICD-10-CM | POA: Diagnosis not present

## 2017-12-31 DIAGNOSIS — E7800 Pure hypercholesterolemia, unspecified: Secondary | ICD-10-CM | POA: Diagnosis not present

## 2018-01-23 ENCOUNTER — Emergency Department: Admission: EM | Admit: 2018-01-23 | Discharge: 2018-01-23 | Discharge status: Against Medical Advice | Payer: 59

## 2018-01-24 DIAGNOSIS — S61411A Laceration without foreign body of right hand, initial encounter: Secondary | ICD-10-CM | POA: Diagnosis not present

## 2018-01-24 DIAGNOSIS — S61011A Laceration without foreign body of right thumb without damage to nail, initial encounter: Secondary | ICD-10-CM | POA: Diagnosis not present

## 2018-01-24 DIAGNOSIS — M79641 Pain in right hand: Secondary | ICD-10-CM | POA: Diagnosis not present

## 2018-01-25 DIAGNOSIS — L308 Other specified dermatitis: Secondary | ICD-10-CM | POA: Diagnosis not present

## 2018-02-02 DIAGNOSIS — S61011A Laceration without foreign body of right thumb without damage to nail, initial encounter: Secondary | ICD-10-CM | POA: Diagnosis not present

## 2018-02-15 ENCOUNTER — Encounter: Payer: 59 | Admitting: Obstetrics and Gynecology

## 2018-02-25 DIAGNOSIS — S61011A Laceration without foreign body of right thumb without damage to nail, initial encounter: Secondary | ICD-10-CM | POA: Diagnosis not present

## 2018-03-03 DIAGNOSIS — L308 Other specified dermatitis: Secondary | ICD-10-CM | POA: Diagnosis not present

## 2018-03-03 DIAGNOSIS — L218 Other seborrheic dermatitis: Secondary | ICD-10-CM | POA: Diagnosis not present

## 2018-03-14 NOTE — Progress Notes (Signed)
Patient ID: Sarah Savage, female   DOB: 14-May-1961, 57 y.o.   MRN: [id number] ANNUAL PREVENTATIVE CARE GYN  ENCOUNTER NOTE  Subjective:       Sarah Savage is a 57 y.o. G4 64 P female here for a routine annual gynecologic exam.  Current complaints: 1.  none  Patient is currently not sexually active.  She does experience some tenderness with intercourse. She does not report any significant urinary urgency or frequency. Patient has history of chronic constipation Patient reports no new major health issues  Gynecologic History No LMP recorded. Patient has had an ablation. Contraception: ablation Last Pap: 02/11/2016 neg/neg.  Last mammogram: 10/2017 birad 1  Obstetric History OB History  Gravida Para Term Preterm AB Living  4 1 1   3 1   SAB TAB Ectopic Multiple Live Births    3     1    # Outcome Date GA Lbr Len/2nd Weight Sex Delivery Anes PTL Lv  4 Term 1991   9 lb 12.8 oz (4.445 kg) M CS-LTranv   LIV  3 TAB 1990          2 TAB 1985          1 TAB             Past Medical History:  Diagnosis Date  . Hyperglycemia   . Hyperlipidemia   . Hypertension   . Plantar fasciitis     Past Surgical History:  Procedure Laterality Date  . BUNIONECTOMY    . ENDOMETRIAL ABLATION    . STOMACH SURGERY     excision of ulcer    Current Outpatient Medications on File Prior to Visit  Medication Sig Dispense Refill  . conjugated estrogens (PREMARIN) vaginal cream Place 5.68 Applicatorfuls vaginally 2 (two) times a week. 42.5 g 12  . diclofenac (VOLTAREN) 75 MG EC tablet Take 1 tablet (75 mg total) by mouth 2 (two) times daily. 14 tablet 0  . metroNIDAZOLE (FLAGYL) 500 MG tablet Take 1 tablet (500 mg total) by mouth 2 (two) times daily. 14 tablet 0   No current facility-administered medications on file prior to visit.     Allergies  Allergen Reactions  . Penicillins     "I gives me a yeast infection"    Social History   Socioeconomic History  . Marital  status: Married    Spouse name: Not on file  . Number of children: Not on file  . Years of education: Not on file  . Highest education level: Not on file  Occupational History  . Not on file  Social Needs  . Financial resource strain: Not on file  . Food insecurity:    Worry: Not on file    Inability: Not on file  . Transportation needs:    Medical: Not on file    Non-medical: Not on file  Tobacco Use  . Smoking status: Never Smoker  . Smokeless tobacco: Never Used  Substance and Sexual Activity  . Alcohol use: Yes    Alcohol/week: 0.0 oz    Comment: occas  . Drug use: No  . Sexual activity: Not Currently  Lifestyle  . Physical activity:    Days per week: Not on file    Minutes per session: Not on file  . Stress: Not on file  Relationships  . Social connections:    Talks on phone: Not on file    Gets together: Not on file    Attends religious service: Not on  file    Active member of club or organization: Not on file    Attends meetings of clubs or organizations: Not on file    Relationship status: Not on file  . Intimate partner violence:    Fear of current or ex partner: Not on file    Emotionally abused: Not on file    Physically abused: Not on file    Forced sexual activity: Not on file  Other Topics Concern  . Not on file  Social History Narrative  . Not on file    Family History  Problem Relation Age of Onset  . Hypertension Mother   . Dementia Mother   . Hypertension Father   . Arthritis Father   . Stroke Brother   . Hypertension Brother   . Diabetes Brother   . Cancer Maternal Grandmother        Breast  . Breast cancer Maternal Grandmother 60    The following portions of the patient's history were reviewed and updated as appropriate: allergies, current medications, past family history, past medical history, past social history, past surgical history and problem list.  Review of Systems Review of Systems  Constitutional: Negative.   HENT:  Negative.   Eyes: Negative.   Respiratory: Negative.   Cardiovascular: Negative.   Gastrointestinal: Positive for constipation.  Genitourinary:       Dyspareunia occasionally  Musculoskeletal: Positive for back pain.  Skin: Negative.   Neurological: Negative.   Endo/Heme/Allergies: Negative.   Psychiatric/Behavioral: Negative.      Objective:   BP 119/83   Pulse 73   Ht 5\' 5"  (1.651 m)   Wt 183 lb (83 kg)   BMI 30.45 kg/m  CONSTITUTIONAL: Well-developed, well-nourished female in no acute distress.  PSYCHIATRIC: Normal mood and affect. Normal behavior. Normal judgment and thought content. [place]: Alert and oriented to person, place, and time. Normal muscle tone coordination. No cranial nerve deficit noted. HENT:  Normocephalic, atraumatic, External right and left ear normal.  EYES: Conjunctivae and EOM are normal.. No scleral icterus.  NECK: Normal range of motion, supple, no masses.  Normal thyroid.  SKIN: Skin is warm and dry. No rash noted. Not diaphoretic. No erythema. No pallor. CARDIOVASCULAR: Normal heart rate noted, regular rhythm, no murmur. RESPIRATORY: Clear to auscultation bilaterally. Effort and breath sounds normal, no problems with respiration noted. BREASTS: Symmetric in size. No masses, skin changes, nipple drainage, or lymphadenopathy. ABDOMEN: Soft, no distention noted.  No tenderness, rebound or guarding.  BLADDER: Normal PELVIC:  External Genitalia: Normal  BUS: Normal  Vagina: Mild atrophy; no discharge; no lesions  Cervix: Normal; no lesions; 3/4 cervical motion tenderness cervical motion tenderness  Uterus: Retroverted, bulky, tender 3/4, decreased mobility, 8 weeks size  Adnexa: Normal; nonpalpable nontender  RV: External hemorrhoids, No Rectal Masses and Normal Sphincter tone abundant firm stool in the rectal vault noted MUSCULOSKELETAL: Normal range of motion. No tenderness.  No cyanosis, clubbing, or edema.  2+ distal pulses. LYMPHATIC: No  Axillary, Supraclavicular, or Inguinal Adenopathy.    Assessment:   Annual gynecologic examination 57 y.o. Contraception: none bmi-30 Vaginal atrophy, Minimally symptomatic History of abnormal Pap smear with most recent Pap smear negative/negative Chronic constipation Enlarged uterus-retroverted, bulky, tender, possibly consistent with adenomyosis  Plan:  Pap: Due 2020 Mammogram: utd Stool Guaiac Testing:  thru pcp Labs: thru pcp Routine preventative health maintenance measures emphasized: Exercise/Diet/Weight control, Tobacco Warnings and Alcohol/Substance use risks Continue estrogen cream twice a week intravaginal Encouraged stool softener once or twice daily;  increase water intake If pelvic pain continues or dyspareunia worsens, return for reevaluation and possible consideration of hysterectomy Return to [place], CMA  Brayton Mars, MD  Note: This dictation was prepared with Dragon dictation along with smaller phrase technology. Any transcriptional errors that result from this process are unintentional.

## 2018-03-16 ENCOUNTER — Encounter: Payer: Self-pay | Admitting: Obstetrics and Gynecology

## 2018-03-16 ENCOUNTER — Ambulatory Visit (INDEPENDENT_AMBULATORY_CARE_PROVIDER_SITE_OTHER): Payer: 59 | Admitting: Obstetrics and Gynecology

## 2018-03-16 VITALS — BP 119/83 | HR 73 | Ht 65.0 in | Wt 183.0 lb

## 2018-03-16 DIAGNOSIS — N952 Postmenopausal atrophic vaginitis: Secondary | ICD-10-CM

## 2018-03-16 DIAGNOSIS — R102 Pelvic and perineal pain: Secondary | ICD-10-CM | POA: Diagnosis not present

## 2018-03-16 DIAGNOSIS — Z01419 Encounter for gynecological examination (general) (routine) without abnormal findings: Secondary | ICD-10-CM | POA: Diagnosis not present

## 2018-03-16 DIAGNOSIS — N852 Hypertrophy of uterus: Secondary | ICD-10-CM | POA: Diagnosis not present

## 2018-03-16 DIAGNOSIS — R638 Other symptoms and signs concerning food and fluid intake: Secondary | ICD-10-CM | POA: Diagnosis not present

## 2018-03-16 DIAGNOSIS — Z780 Asymptomatic menopausal state: Secondary | ICD-10-CM

## 2018-03-16 NOTE — Patient Instructions (Addendum)
1.  Pap smear not done.  Next Pap smear is due in 2020 2.  Mammogram already obtained this year 3.  Screening labs and stool guaiac testing is obtained through primary care 4.  Continue with healthy eating and exercise with controlled weight loss 5.  Recommend calcium and vitamin D supplementation daily-1200 mg of calcium and 800 international units vitamin D.  Options of calcium supplementation include:  Citracal  Os-Cal  Viactiv chews  Tums 6.  Premarin cream intravaginal twice weekly is refilled 7.  Return in 1 year for annual exam   Health Maintenance for Postmenopausal Women Menopause is a normal process in which your reproductive ability comes to an end. This process happens gradually over a span of months to years, usually between the ages of 13 and 39. Menopause is complete when you have missed 12 consecutive menstrual periods. It is important to talk with your health care provider about some of the most common conditions that affect postmenopausal women, such as heart disease, cancer, and bone loss (osteoporosis). Adopting a healthy lifestyle and getting preventive care can help to promote your health and wellness. Those actions can also lower your chances of developing some of these common conditions. What should I know about menopause? During menopause, you may experience a number of symptoms, such as:  Moderate-to-severe hot flashes.  Night sweats.  Decrease in sex drive.  Mood swings.  Headaches.  Tiredness.  Irritability.  Memory problems.  Insomnia.  Choosing to treat or not to treat menopausal changes is an individual decision that you make with your health care provider. What should I know about hormone replacement therapy and supplements? Hormone therapy products are effective for treating symptoms that are associated with menopause, such as hot flashes and night sweats. Hormone replacement carries certain risks, especially as you become older. If you are  thinking about using estrogen or estrogen with progestin treatments, discuss the benefits and risks with your health care provider. What should I know about heart disease and stroke? Heart disease, heart attack, and stroke become more likely as you age. This may be due, in part, to the hormonal changes that your body experiences during menopause. These can affect how your body processes dietary fats, triglycerides, and cholesterol. Heart attack and stroke are both medical emergencies. There are many things that you can do to help prevent heart disease and stroke:  Have your blood pressure checked at least every 1-2 years. High blood pressure causes heart disease and increases the risk of stroke.  If you are 54-4 years old, ask your health care provider if you should take aspirin to prevent a heart attack or a stroke.  Do not use any tobacco products, including cigarettes, chewing tobacco, or electronic cigarettes. If you need help quitting, ask your health care provider.  It is important to eat a healthy diet and maintain a healthy weight. ? Be sure to include plenty of vegetables, fruits, low-fat dairy products, and lean protein. ? Avoid eating foods that are high in solid fats, added sugars, or salt (sodium).  Get regular exercise. This is one of the most important things that you can do for your health. ? Try to exercise for at least 150 minutes each week. The type of exercise that you do should increase your heart rate and make you sweat. This is known as moderate-intensity exercise. ? Try to do strengthening exercises at least twice each week. Do these in addition to the moderate-intensity exercise.  Know your numbers.Ask your  health care provider to check your cholesterol and your blood glucose. Continue to have your blood tested as directed by your health care provider.  What should I know about cancer screening? There are several types of cancer. Take the following steps to reduce  your risk and to catch any cancer development as early as possible. Breast Cancer  Practice breast self-awareness. ? This means understanding how your breasts normally appear and feel. ? It also means doing regular breast self-exams. Let your health care provider know about any changes, no matter how small.  If you are 53 or older, have a clinician do a breast exam (clinical breast exam or CBE) every year. Depending on your age, family history, and medical history, it may be recommended that you also have a yearly breast X-ray (mammogram).  If you have a family history of breast cancer, talk with your health care provider about genetic screening.  If you are at high risk for breast cancer, talk with your health care provider about having an MRI and a mammogram every year.  Breast cancer (BRCA) gene test is recommended for women who have family members with BRCA-related cancers. Results of the assessment will determine the need for genetic counseling and BRCA1 and for BRCA2 testing. BRCA-related cancers include these types: ? Breast. This occurs in males or females. ? Ovarian. ? Tubal. This may also be called fallopian tube cancer. ? Cancer of the abdominal or pelvic lining (peritoneal cancer). ? Prostate. ? Pancreatic.  Cervical, Uterine, and Ovarian Cancer Your health care provider may recommend that you be screened regularly for cancer of the pelvic organs. These include your ovaries, uterus, and vagina. This screening involves a pelvic exam, which includes checking for microscopic changes to the surface of your cervix (Pap test).  For women ages 21-65, health care providers may recommend a pelvic exam and a Pap test every three years. For women ages 56-65, they may recommend the Pap test and pelvic exam, combined with testing for human papilloma virus (HPV), every five years. Some types of HPV increase your risk of cervical cancer. Testing for HPV may also be done on women of any age who  have unclear Pap test results.  Other health care providers may not recommend any screening for nonpregnant women who are considered low risk for pelvic cancer and have no symptoms. Ask your health care provider if a screening pelvic exam is right for you.  If you have had past treatment for cervical cancer or a condition that could lead to cancer, you need Pap tests and screening for cancer for at least 20 years after your treatment. If Pap tests have been discontinued for you, your risk factors (such as having a new sexual partner) need to be reassessed to determine if you should start having screenings again. Some women have medical problems that increase the chance of getting cervical cancer. In these cases, your health care provider may recommend that you have screening and Pap tests more often.  If you have a family history of uterine cancer or ovarian cancer, talk with your health care provider about genetic screening.  If you have vaginal bleeding after reaching menopause, tell your health care provider.  There are currently no reliable tests available to screen for ovarian cancer.  Lung Cancer Lung cancer screening is recommended for adults 43-74 years old who are at high risk for lung cancer because of a history of smoking. A yearly low-dose CT scan of the lungs is recommended if  you:  Currently smoke.  Have a history of at least 30 pack-years of smoking and you currently smoke or have quit within the past 15 years. A pack-year is smoking an average of one pack of cigarettes per day for one year.  Yearly screening should:  Continue until it has been 15 years since you quit.  Stop if you develop a health problem that would prevent you from having lung cancer treatment.  Colorectal Cancer  This type of cancer can be detected and can often be prevented.  Routine colorectal cancer screening usually begins at age 29 and continues through age 107.  If you have risk factors for colon  cancer, your health care provider may recommend that you be screened at an earlier age.  If you have a family history of colorectal cancer, talk with your health care provider about genetic screening.  Your health care provider may also recommend using home test kits to check for hidden blood in your stool.  A small camera at the end of a tube can be used to examine your colon directly (sigmoidoscopy or colonoscopy). This is done to check for the earliest forms of colorectal cancer.  Direct examination of the colon should be repeated every 5-10 years until age 28. However, if early forms of precancerous polyps or small growths are found or if you have a family history or genetic risk for colorectal cancer, you may need to be screened more often.  Skin Cancer  Check your skin from head to toe regularly.  Monitor any moles. Be sure to tell your health care provider: ? About any new moles or changes in moles, especially if there is a change in a mole's shape or color. ? If you have a mole that is larger than the size of a pencil eraser.  If any of your family members has a history of skin cancer, especially at a young age, talk with your health care provider about genetic screening.  Always use sunscreen. Apply sunscreen liberally and repeatedly throughout the day.  Whenever you are outside, protect yourself by wearing Buzby sleeves, pants, a wide-brimmed hat, and sunglasses.  What should I know about osteoporosis? Osteoporosis is a condition in which bone destruction happens more quickly than new bone creation. After menopause, you may be at an increased risk for osteoporosis. To help prevent osteoporosis or the bone fractures that can happen because of osteoporosis, the following is recommended:  If you are 35-30 years old, get at least 1,000 mg of calcium and at least 600 mg of vitamin D per day.  If you are older than age 43 but younger than age 40, get at least 1,200 mg of calcium and  at least 600 mg of vitamin D per day.  If you are older than age 47, get at least 1,200 mg of calcium and at least 800 mg of vitamin D per day.  Smoking and excessive alcohol intake increase the risk of osteoporosis. Eat foods that are rich in calcium and vitamin D, and do weight-bearing exercises several times each week as directed by your health care provider. What should I know about how menopause affects my mental health? Depression may occur at any age, but it is more common as you become older. Common symptoms of depression include:  Low or sad mood.  Changes in sleep patterns.  Changes in appetite or eating patterns.  Feeling an overall lack of motivation or enjoyment of activities that you previously enjoyed.  Frequent crying  spells.  Talk with your health care provider if you think that you are experiencing depression. What should I know about immunizations? It is important that you get and maintain your immunizations. These include:  Tetanus, diphtheria, and pertussis (Tdap) booster vaccine.  Influenza every year before the flu season begins.  Pneumonia vaccine.  Shingles vaccine.  Your health care provider may also recommend other immunizations. This information is not intended to replace advice given to you by your health care provider. Make sure you discuss any questions you have with your health care provider. Document Released: 12/04/2005 Document Revised: 05/01/2016 Document Reviewed: 07/16/2015 Elsevier Interactive Patient Education  2018 [employer].

## 2018-03-24 DIAGNOSIS — M1712 Unilateral primary osteoarthritis, left knee: Secondary | ICD-10-CM | POA: Diagnosis not present

## 2018-03-24 DIAGNOSIS — M25562 Pain in left knee: Secondary | ICD-10-CM | POA: Diagnosis not present

## 2018-03-24 DIAGNOSIS — M1711 Unilateral primary osteoarthritis, right knee: Secondary | ICD-10-CM | POA: Diagnosis not present

## 2018-03-29 DIAGNOSIS — S61011D Laceration without foreign body of right thumb without damage to nail, subsequent encounter: Secondary | ICD-10-CM | POA: Diagnosis not present

## 2018-04-08 IMAGING — MG MM DIGITAL SCREENING BILAT W/ TOMO W/ CAD
8 of 13 series · 8 of 29 positions shown · non-contrast
Comparison: Previous exam(s).

CLINICAL DATA: Screening.

EXAM:
2D DIGITAL SCREENING BILATERAL MAMMOGRAM WITH 3D TOMO WITH CAD

[R MLO (1 of 2)]
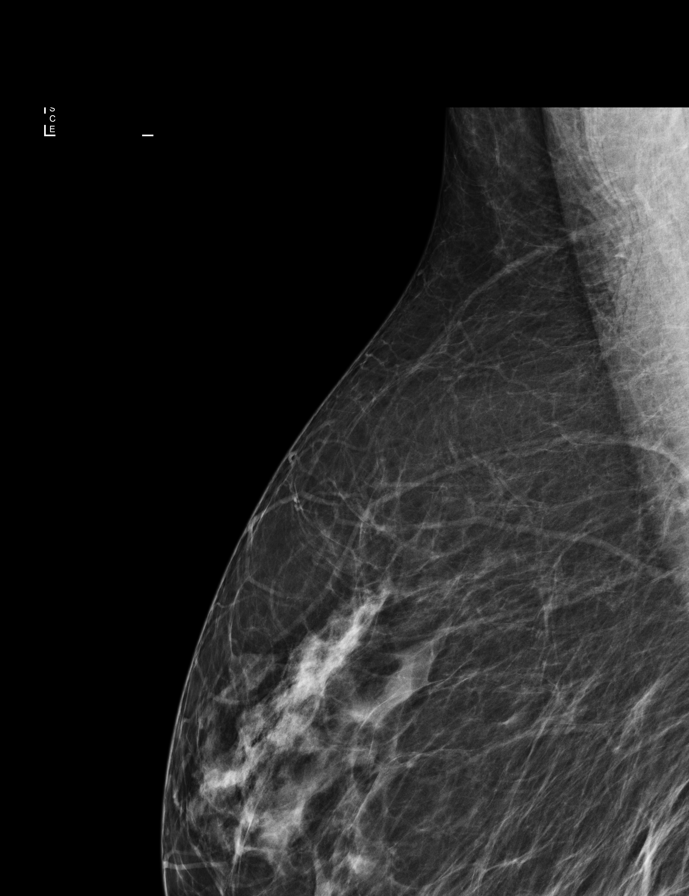

[R CC synth-2D]
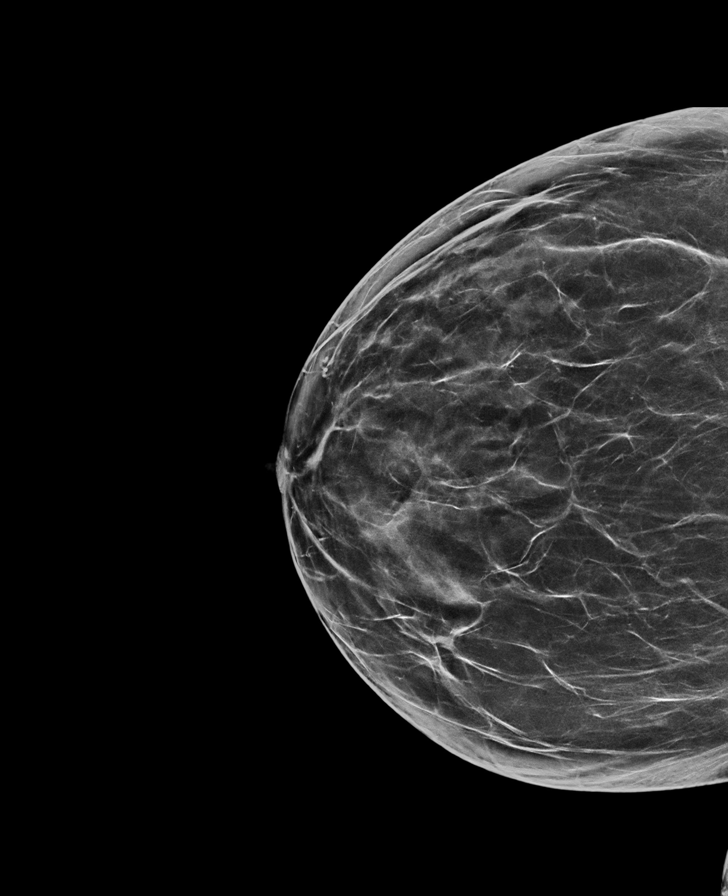

[L CC]
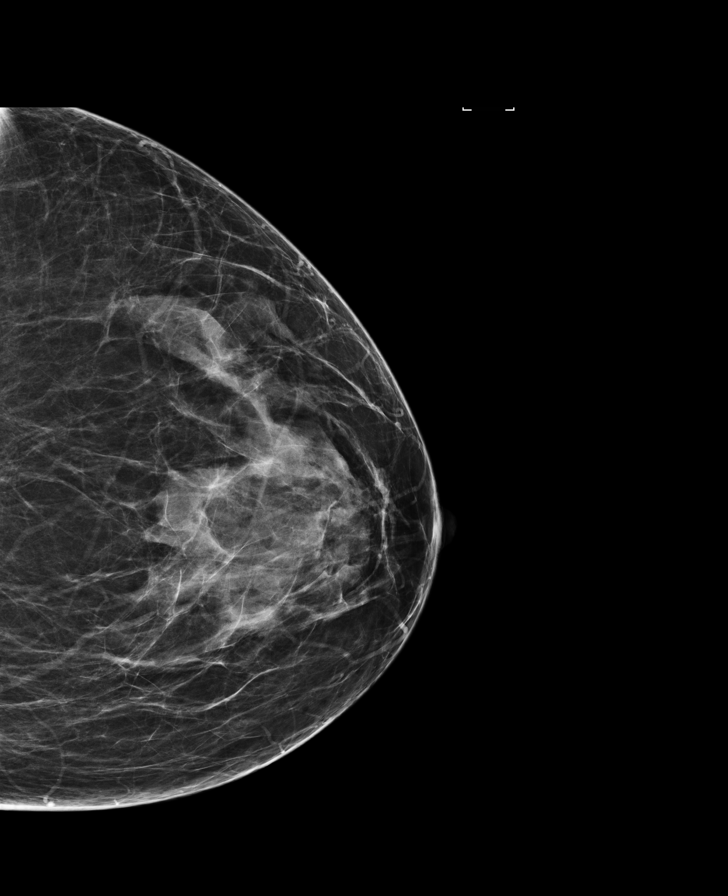

[R CC]
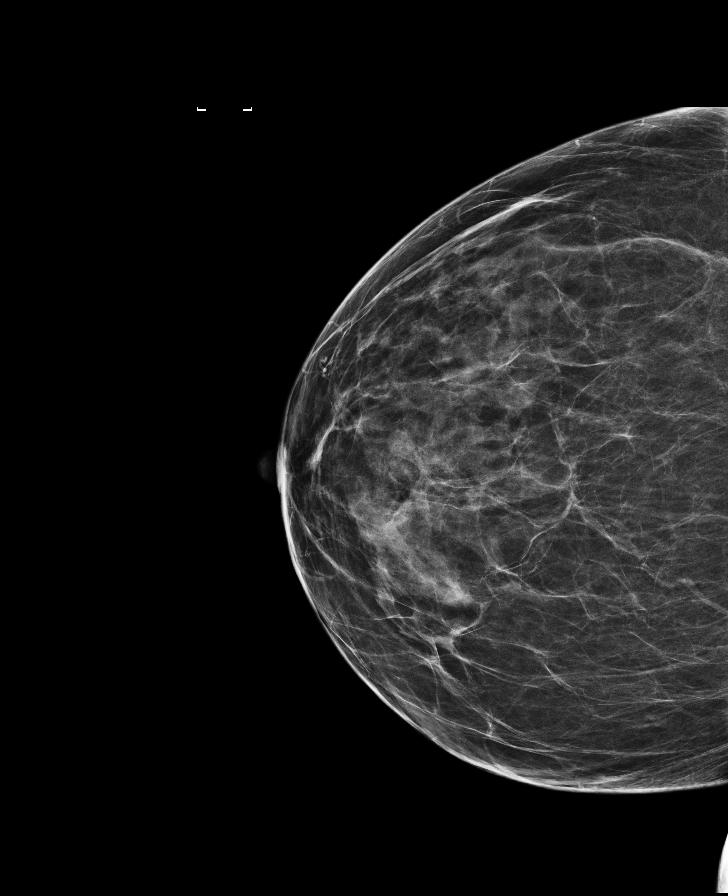

[R MLO synth-2D]
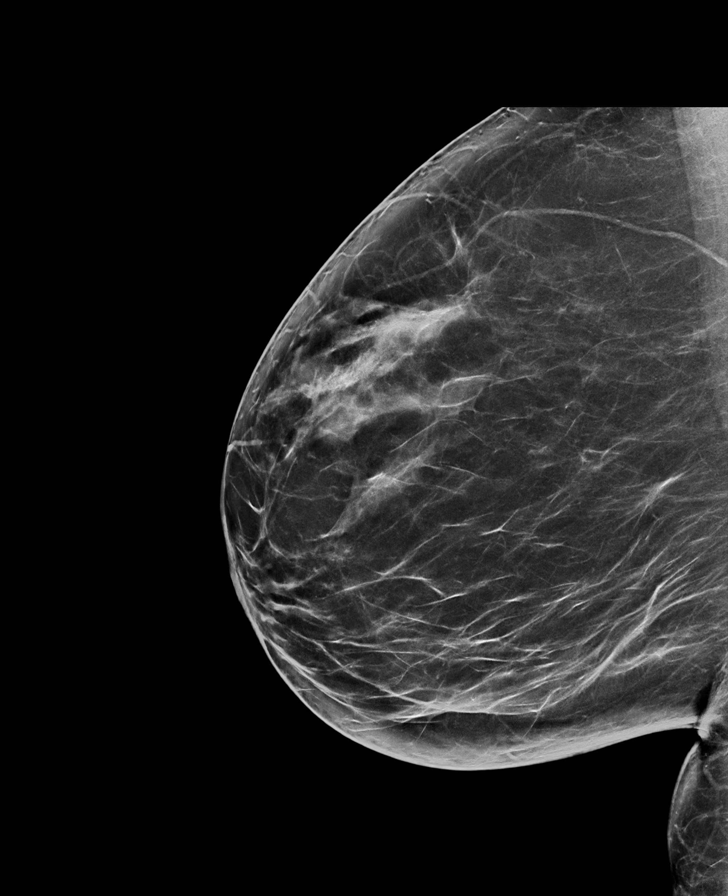

[L MLO]
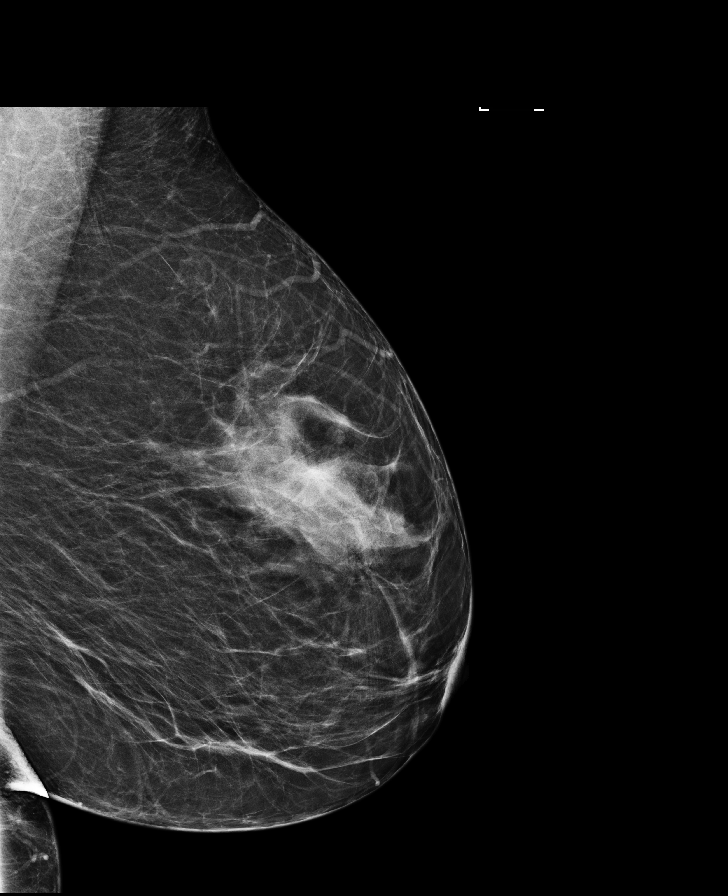

[R MLO (2 of 2)]
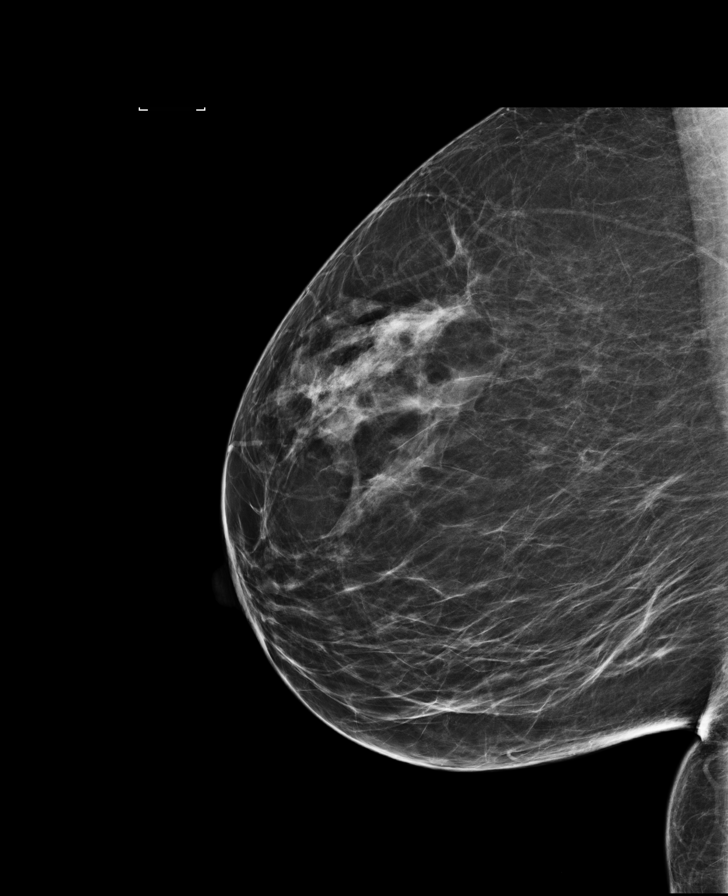

[L CC synth-2D]
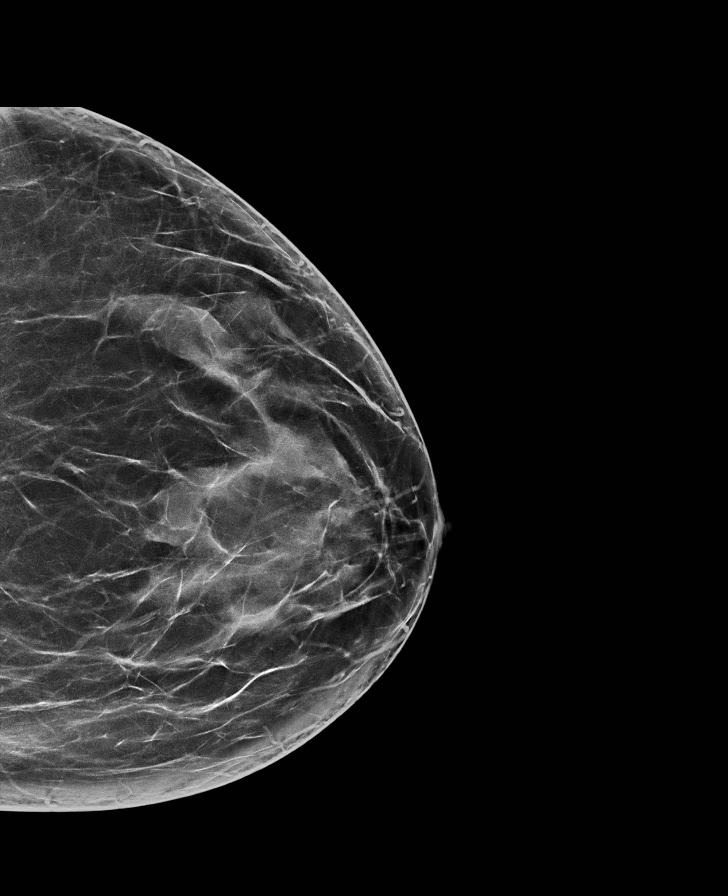

[8 of 29 positions shown; findings below may reference images not displayed]

ACR Breast Density Category b: There are scattered areas of
fibroglandular density.
FINDINGS: There are no findings suspicious for malignancy. Images were
processed with CAD.
IMPRESSION: No mammographic evidence of malignancy. A result letter of this
screening mammogram will be mailed directly to the patient.

RECOMMENDATION:
Screening mammogram in one year. (Code:[id number]-P-ZS0)

BI-RADS CATEGORY  1: Negative.

## 2018-04-13 DIAGNOSIS — M1711 Unilateral primary osteoarthritis, right knee: Secondary | ICD-10-CM | POA: Diagnosis not present

## 2018-04-13 DIAGNOSIS — M1712 Unilateral primary osteoarthritis, left knee: Secondary | ICD-10-CM | POA: Diagnosis not present

## 2018-04-13 DIAGNOSIS — M25562 Pain in left knee: Secondary | ICD-10-CM | POA: Diagnosis not present

## 2018-04-26 DIAGNOSIS — M25562 Pain in left knee: Secondary | ICD-10-CM | POA: Diagnosis not present

## 2018-05-02 DIAGNOSIS — G8929 Other chronic pain: Secondary | ICD-10-CM | POA: Diagnosis not present

## 2018-05-02 DIAGNOSIS — R739 Hyperglycemia, unspecified: Secondary | ICD-10-CM | POA: Diagnosis not present

## 2018-05-02 DIAGNOSIS — M25562 Pain in left knee: Secondary | ICD-10-CM | POA: Diagnosis not present

## 2018-05-02 DIAGNOSIS — Z79899 Other long term (current) drug therapy: Secondary | ICD-10-CM | POA: Diagnosis not present

## 2018-05-02 DIAGNOSIS — I10 Essential (primary) hypertension: Secondary | ICD-10-CM | POA: Diagnosis not present

## 2018-05-02 DIAGNOSIS — M25561 Pain in right knee: Secondary | ICD-10-CM | POA: Diagnosis not present

## 2018-05-11 DIAGNOSIS — M25561 Pain in right knee: Secondary | ICD-10-CM | POA: Diagnosis not present

## 2018-05-11 DIAGNOSIS — M1711 Unilateral primary osteoarthritis, right knee: Secondary | ICD-10-CM | POA: Diagnosis not present

## 2018-05-25 DIAGNOSIS — H902 Conductive hearing loss, unspecified: Secondary | ICD-10-CM | POA: Diagnosis not present

## 2018-05-25 DIAGNOSIS — H6123 Impacted cerumen, bilateral: Secondary | ICD-10-CM | POA: Diagnosis not present

## 2018-06-09 NOTE — Progress Notes (Signed)
LOV FM, DR. JEFFREY SPARKS 12-31-17 [facility]

## 2018-06-09 NOTE — H&P (Signed)
UNICOMPARTMENTAL KNEE ADMISSION H&P  Patient is being admitted for right medial unicompartmental knee arthroplasty.  Subjective:  Chief Complaint:  Right knee medial compartmental primary OA /pain    HPI: Sarah Savage, 57 y.o. female , has a history of pain and functional disability in the right and has failed non-surgical conservative treatments for greater than 12 weeks to include NSAID's and/or analgesics, corticosteriod injections and activity modification.  Onset of symptoms was gradual, starting 3 years ago with gradually worsening course since that time. The patient noted no past surgery on the right knee(s).  Patient currently rates pain in the right knee(s) at 10 out of 10 with activity. Patient has night pain, worsening of pain with activity and weight bearing, pain that interferes with activities of daily living, pain with passive range of motion, crepitus and joint swelling.  Patient has evidence of periarticular osteophytes and joint space narrowing of the medial compartment by imaging studies.  There is no active infection.  Risks, benefits and expectations were discussed with the patient.  Risks including but not limited to the risk of anesthesia, blood clots, nerve damage, blood vessel damage, failure of the prosthesis, infection and up to and including death.  Patient understand the risks, benefits and expectations and wishes to proceed with surgery.   PCP: Idelle Crouch, MD  D/C Plans:       Home   Post-op Meds:       No Rx given   Tranexamic Acid:      To be given - IV   Decadron:      Is to be given  FYI:      ASA  Norco  DME:   Rx given for - RW & 3-n-1  PT:   OPPT Rx given    Past Medical History:  Diagnosis Date  . Hyperglycemia   . Hyperlipidemia   . Hypertension   . Plantar fasciitis      Past Surgical History:  Procedure Laterality Date  . BUNIONECTOMY    . ENDOMETRIAL ABLATION    . STOMACH SURGERY     excision of ulcer     Allergies  Allergen Reactions  . Penicillins Other (See Comments)    "I gives me a yeast infection" Has patient had a PCN reaction causing immediate rash, facial/tongue/throat swelling, SOB or lightheadedness with hypotension: No Has patient had a PCN reaction causing severe rash involving mucus membranes or skin necrosis: No Has patient had a PCN reaction that required hospitalization: No Has patient had a PCN reaction occurring within the last 10 years: No If all of the above answers are "NO", then may proceed with Cephalosporin use.     Social History   Tobacco Use  . Smoking status: Never Smoker  . Smokeless tobacco: Never Used  Substance Use Topics  . Alcohol use: Yes    Alcohol/week: 0.0 standard drinks    Comment: occas  . Drug use: No    Family History  Problem Relation Age of Onset  . Hypertension Mother   . Dementia Mother   . Hypertension Father   . Arthritis Father   . Stroke Brother   . Hypertension Brother   . Diabetes Brother   . Cancer Maternal Grandmother        Breast  . Breast cancer Maternal Grandmother 60     Review of Systems  Constitutional: Negative.   HENT: Negative.   Eyes: Negative.   Respiratory: Negative.   Cardiovascular:  Negative.   Gastrointestinal: Negative.   Genitourinary: Negative.   Musculoskeletal: Positive for joint pain.  Skin: Negative.   Neurological: Negative.   Endo/Heme/Allergies: Negative.   Psychiatric/Behavioral: Negative.      Objective:   Physical Exam  Eyes: Pupils are equal, round, and reactive to light.  Neck: Neck supple. No JVD present. No tracheal deviation present. No thyromegaly present.  Cardiovascular: Normal rate, regular rhythm and intact distal pulses.  Pulmonary/Chest: Effort normal and breath sounds normal. No respiratory distress. She has no wheezes.  Abdominal: Soft. There is no tenderness. There is no guarding.  Musculoskeletal:       Right knee: She exhibits decreased range of  motion, swelling and bony tenderness. She exhibits no ecchymosis, no deformity, no laceration and no erythema. Tenderness found. Medial joint line tenderness noted. No lateral joint line tenderness noted.  Lymphadenopathy:    She has no cervical adenopathy.  Neurological: She is alert.  Skin: Skin is warm and dry.       Labs:  Estimated body mass index is 30.45 kg/m as calculated from the following:   Height as of 03/16/18: 5\' 5"  (1.651 m).   Weight as of 03/16/18: 83 kg.   Imaging Review Plain radiographs demonstrate moderate degenerative joint disease of the right knee medial compartment. The bone quality appears to be good for age and reported activity level.  Assessment/Plan:  End stage arthritis, right knee medial compartment  The patient history, physical examination, clinical judgment of the provider and imaging studies are consistent with end stage degenerative joint disease of the right knee(s) and medial unicompartmental knee arthroplasty is deemed medically necessary. The treatment options including medical management, injection therapy arthroscopy and arthroplasty were discussed at length. The risks and benefits of total knee arthroplasty were presented and reviewed. The risks due to aseptic loosening, infection, stiffness, patella tracking problems, thromboembolic complications and other imponderables were discussed. The patient acknowledged the explanation, agreed to proceed with the plan and consent was signed. Patient is being admitted for outpatient / observation treatment for surgery, pain control, PT, OT, prophylactic antibiotics, VTE prophylaxis, progressive ambulation and ADL's and discharge planning. The patient is planning to be discharged home.    [place] Pugh Mio Schellinger   PA-C  06/09/2018, 2:14 PM

## 2018-06-09 NOTE — Patient Instructions (Signed)
Sarah Savage  06/09/2018   Your procedure is scheduled on: 06-21-18   Report to [facility] Main  Entrance    Report to admitting at 5:30AM    Call this number if you have problems the morning of surgery [phone]     Remember: Do not eat food or drink liquids :After Midnight.     Take these medicines the morning of surgery with A SIP OF WATER: TRAMADOL IF NEEDED                                You may not have any metal on your body including hair pins and              piercings  Do not wear jewelry, make-up, lotions, powders or perfumes, deodorant             Do not wear nail polish.  Do not shave  48 hours prior to surgery.               Do not bring valuables to the hospital. [place].  Contacts, dentures or bridgework may not be worn into surgery.      Patients discharged the day of surgery will not be allowed to drive home.  Name and phone number of your driver:  Special Instructions: N/A              Please read over the following fact sheets you were given: _____________________________________________________________________             [facility] - Preparing for Surgery Before surgery, you can play an important role.  Because skin is not sterile, your skin needs to be as free of germs as possible.  You can reduce the number of germs on your skin by washing with CHG (chlorahexidine gluconate) soap before surgery.  CHG is an antiseptic cleaner which kills germs and bonds with the skin to continue killing germs even after washing. Please DO NOT use if you have an allergy to CHG or antibacterial soaps.  If your skin becomes reddened/irritated stop using the CHG and inform your nurse when you arrive at Short Stay. Do not shave (including legs and underarms) for at least 48 hours prior to the first CHG shower.  You may shave your face/neck. Please follow these instructions  carefully:  1.  Shower with CHG Soap the night before surgery and the  morning of Surgery.  2.  If you choose to wash your hair, wash your hair first as usual with your  normal  shampoo.  3.  After you shampoo, rinse your hair and body thoroughly to remove the  shampoo.                           4.  Use CHG as you would any other liquid soap.  You can apply chg directly  to the skin and wash                       Gently with a scrungie or clean washcloth.  5.  Apply the CHG Soap to your body ONLY FROM THE NECK DOWN.   Do not use on face/  open                           Wound or open sores. Avoid contact with eyes, ears mouth and genitals (private parts).                       Wash face,  Genitals (private parts) with your normal soap.             6.  Wash thoroughly, paying special attention to the area where your surgery  will be performed.  7.  Thoroughly rinse your body with warm water from the neck down.  8.  DO NOT shower/wash with your normal soap after using and rinsing off  the CHG Soap.                9.  Pat yourself dry with a clean towel.            10.  Wear clean pajamas.            11.  Place clean sheets on your bed the night of your first shower and do not  sleep with pets. Day of Surgery : Do not apply any lotions/deodorants the morning of surgery.  Please wear clean clothes to the hospital/surgery center.  FAILURE TO FOLLOW THESE INSTRUCTIONS MAY RESULT IN THE CANCELLATION OF YOUR SURGERY PATIENT SIGNATURE_________________________________  NURSE SIGNATURE__________________________________  ________________________________________________________________________   [name]  An incentive spirometer is a tool that can help keep your lungs clear and active. This tool measures how well you are filling your lungs with each breath. Taking Reasoner deep breaths may help reverse or decrease the chance of developing breathing (pulmonary) problems (especially infection)  following:  A [place] period of time when you are unable to move or be active. BEFORE THE PROCEDURE   If the spirometer includes an indicator to show your best effort, your nurse or respiratory therapist will set it to a desired goal.  If possible, sit up straight or lean slightly forward. Try not to slouch.  Hold the incentive spirometer in an upright position. INSTRUCTIONS FOR USE  1. Sit on the edge of your bed if possible, or sit up as far as you can in bed or on a chair. 2. Hold the incentive spirometer in an upright position. 3. Breathe out normally. 4. Place the mouthpiece in your mouth and seal your lips tightly around it. 5. Breathe in slowly and as deeply as possible, raising the piston or the ball toward the top of the column. 6. Hold your breath for 3-5 seconds or for as [place] as possible. Allow the piston or ball to fall to the bottom of the column. 7. Remove the mouthpiece from your mouth and breathe out normally. 8. Rest for a few seconds and repeat Steps 1 through 7 at least 10 times every 1-2 hours when you are awake. Take your time and take a few normal breaths between deep breaths. 9. The spirometer may include an indicator to show your best effort. Use the indicator as a goal to work toward during each repetition. 10. After each set of 10 deep breaths, practice coughing to be sure your lungs are clear. If you have an incision (the cut made at the time of surgery), support your incision when coughing by placing a pillow or rolled up towels firmly against it. Once you are able to get out of bed, walk around indoors and  cough well. You may stop using the incentive spirometer when instructed by your caregiver.  RISKS AND COMPLICATIONS  Take your time so you do not get dizzy or light-headed.  If you are in pain, you may need to take or ask for pain medication before doing incentive spirometry. It is harder to take a deep breath if you are having pain. AFTER USE  Rest and  breathe slowly and easily.  It can be helpful to keep track of a log of your progress. Your caregiver can provide you with a simple table to help with this. If you are using the spirometer at home, follow these instructions: [place] IF:   You are having difficultly using the spirometer.  You have trouble using the spirometer as often as instructed.  Your pain medication is not giving enough relief while using the spirometer.  You develop fever of 100.5 F (38.1 C) or higher. SEEK IMMEDIATE MEDICAL CARE IF:   You cough up bloody sputum that had not been present before.  You develop fever of 102 F (38.9 C) or greater.  You develop worsening pain at or near the incision site. MAKE SURE YOU:   Understand these instructions.  Will watch your condition.  Will get help right away if you are not doing well or get worse. Document Released: 02/22/2007 Document Revised: 01/04/2012 Document Reviewed: 04/25/2007 ExitCare Patient Information 2014 Exit[employer], [place].   ________________________________________________________________________  WHAT IS A BLOOD TRANSFUSION? Blood Transfusion Information  A transfusion is the replacement of blood or some of its parts. Blood is made up of multiple cells which provide different functions.  Red blood cells carry oxygen and are used for blood loss replacement.  White blood cells fight against infection.  Platelets control bleeding.  Plasma helps clot blood.  Other blood products are available for specialized needs, such as hemophilia or other clotting disorders. BEFORE THE TRANSFUSION  Who gives blood for transfusions?   Healthy volunteers who are fully evaluated to make sure their blood is safe. This is blood bank blood. Transfusion therapy is the safest it has ever been in the practice of medicine. Before blood is taken from a donor, a complete history is taken to make sure that person has no history of diseases nor engages in  risky social behavior (examples are intravenous drug use or sexual activity with multiple partners). The donor's travel history is screened to minimize risk of transmitting infections, such as malaria. The donated blood is tested for signs of infectious diseases, such as HIV and hepatitis. The blood is then tested to be sure it is compatible with you in order to minimize the chance of a transfusion reaction. If you or a relative donates blood, this is often done in anticipation of surgery and is not appropriate for emergency situations. It takes many days to process the donated blood. RISKS AND COMPLICATIONS Although transfusion therapy is very safe and saves many lives, the main dangers of transfusion include:   Getting an infectious disease.  Developing a transfusion reaction. This is an allergic reaction to something in the blood you were given. Every precaution is taken to prevent this. The decision to have a blood transfusion has been considered carefully by your caregiver before blood is given. Blood is not given unless the benefits outweigh the risks. AFTER THE TRANSFUSION  Right after receiving a blood transfusion, you will usually feel much better and more energetic. This is especially true if your red blood cells have gotten low (anemic).  The transfusion raises the level of the red blood cells which carry oxygen, and this usually causes an energy increase.  The nurse administering the transfusion will monitor you carefully for complications. HOME CARE INSTRUCTIONS  No special instructions are needed after a transfusion. You may find your energy is better. Speak with your caregiver about any limitations on activity for underlying diseases you may have. SEEK MEDICAL CARE IF:   Your condition is not improving after your transfusion.  You develop redness or irritation at the intravenous (IV) site. SEEK IMMEDIATE MEDICAL CARE IF:  Any of the following symptoms occur over the next 12  hours:  Shaking chills.  You have a temperature by mouth above 102 F (38.9 C), not controlled by medicine.  Chest, back, or muscle pain.  People around you feel you are not acting correctly or are confused.  Shortness of breath or difficulty breathing.  Dizziness and fainting.  You get a rash or develop hives.  You have a decrease in urine output.  Your urine turns a dark color or changes to pink, red, or brown. Any of the following symptoms occur over the next 10 days:  You have a temperature by mouth above 102 F (38.9 C), not controlled by medicine.  Shortness of breath.  Weakness after normal activity.  The white part of the eye turns yellow (jaundice).  You have a decrease in the amount of urine or are urinating less often.  Your urine turns a dark color or changes to pink, red, or brown. Document Released: 10/09/2000 Document Revised: 01/04/2012 Document Reviewed: 05/28/2008 [facility] Patient Information 2014 [place], [place].  _______________________________________________________________________

## 2018-06-14 ENCOUNTER — Encounter (HOSPITAL_COMMUNITY)
Admission: RE | Admit: 2018-06-14 | Discharge: 2018-06-14 | Disposition: A | Discharge status: Home / Self Care | Payer: 59 | Source: Ambulatory Visit | Attending: Orthopedic Surgery | Admitting: Orthopedic Surgery

## 2018-06-14 ENCOUNTER — Other Ambulatory Visit: Payer: Self-pay

## 2018-06-14 ENCOUNTER — Encounter (HOSPITAL_COMMUNITY): Payer: Self-pay

## 2018-06-14 DIAGNOSIS — Z01812 Encounter for preprocedural laboratory examination: Secondary | ICD-10-CM | POA: Diagnosis not present

## 2018-06-14 DIAGNOSIS — I10 Essential (primary) hypertension: Secondary | ICD-10-CM | POA: Diagnosis not present

## 2018-06-14 DIAGNOSIS — Z01810 Encounter for preprocedural cardiovascular examination: Secondary | ICD-10-CM | POA: Insufficient documentation

## 2018-06-14 LAB — CBC
HEMATOCRIT: 40.1 % (ref 36.0–46.0)
Hemoglobin: 12.9 g/dL (ref 12.0–15.0)
MCH: 27.1 pg (ref 26.0–34.0)
MCHC: 32.2 g/dL (ref 30.0–36.0)
MCV: 84.2 fL (ref 78.0–100.0)
PLATELETS: 369 10*3/uL (ref 150–400)
RBC: 4.76 MIL/uL (ref 3.87–5.11)
RDW: 13.5 % (ref 11.5–15.5)
WBC: 6 10*3/uL (ref 4.0–10.5)

## 2018-06-14 LAB — BASIC METABOLIC PANEL
Anion gap: 8 (ref 5–15)
BUN: 15 mg/dL (ref 6–20)
CHLORIDE: 105 mmol/L (ref 98–111)
CO2: 28 mmol/L (ref 22–32)
CREATININE: 0.84 mg/dL (ref 0.44–1.00)
Calcium: 9 mg/dL (ref 8.9–10.3)
GFR calc Af Amer: >60 mL/min (ref >60)
GFR calc non Af Amer: >60 mL/min (ref >60)
GLUCOSE: 95 mg/dL (ref 70–99)
POTASSIUM: 4.3 mmol/L (ref 3.5–5.1)
Sodium: 141 mmol/L (ref 135–145)

## 2018-06-14 LAB — SURGICAL PCR SCREEN
MRSA, PCR: NEGATIVE
Staphylococcus aureus: NEGATIVE

## 2018-06-15 LAB — ABO/RH: ABO/RH(D): A POS

## 2018-06-20 NOTE — Anesthesia Preprocedure Evaluation (Addendum)
Anesthesia Evaluation  Patient identified by MRN, date of birth, ID band Patient awake    Reviewed: Allergy & Precautions, NPO status , Patient's Chart, lab work & pertinent test results  Airway Mallampati: II  TM Distance: >3 FB Neck ROM: Full    Dental  (+) Dental Advisory Given   Pulmonary neg pulmonary ROS,    breath sounds clear to auscultation       Cardiovascular hypertension,  Rhythm:Regular Rate:Normal     Neuro/Psych negative neurological ROS     GI/Hepatic Neg liver ROS, PUD,   Endo/Other  negative endocrine ROS  Renal/GU negative Renal ROS     Musculoskeletal   Abdominal   Peds  Hematology negative hematology ROS (+)   Anesthesia Other Findings   Reproductive/Obstetrics                            Lab Results  Component Value Date   WBC 6.0 06/14/2018   HGB 12.9 06/14/2018   HCT 40.1 06/14/2018   MCV 84.2 06/14/2018   PLT 369 06/14/2018   Lab Results  Component Value Date   CREATININE 0.84 06/14/2018   BUN 15 06/14/2018   NA 141 06/14/2018   K 4.3 06/14/2018   CL 105 06/14/2018   CO2 28 06/14/2018    Anesthesia Physical Anesthesia Plan  ASA: II  Anesthesia Plan: Spinal   Post-op Pain Management:  Regional for Post-op pain   Induction: Intravenous  PONV Risk Score and Plan: 2 and Propofol infusion and Treatment may vary due to age or medical condition  Airway Management Planned: Natural Airway and Simple Face Mask  Additional Equipment:   Intra-op Plan:   Post-operative Plan:   Informed Consent: I have reviewed the patients History and Physical, chart, labs and discussed the procedure including the risks, benefits and alternatives for the proposed anesthesia with the patient or authorized representative who has indicated his/her understanding and acceptance.     Plan Discussed with: CRNA  Anesthesia Plan Comments:        Anesthesia Quick  Evaluation

## 2018-06-21 ENCOUNTER — Ambulatory Visit (HOSPITAL_COMMUNITY): Payer: 59 | Admitting: Anesthesiology

## 2018-06-21 ENCOUNTER — Encounter (HOSPITAL_COMMUNITY): Payer: Self-pay | Admitting: *Deleted

## 2018-06-21 ENCOUNTER — Encounter (HOSPITAL_COMMUNITY)
Admission: RE | Disposition: A | Discharge status: Home / Self Care | Payer: Self-pay | Source: Ambulatory Visit | Attending: Orthopedic Surgery

## 2018-06-21 ENCOUNTER — Observation Stay (HOSPITAL_COMMUNITY)
Admission: RE | Admit: 2018-06-21 | Discharge: 2018-06-22 | Disposition: A | Discharge status: Home / Self Care | Payer: 59 | Source: Ambulatory Visit | Attending: Orthopedic Surgery | Admitting: Orthopedic Surgery

## 2018-06-21 ENCOUNTER — Other Ambulatory Visit: Payer: Self-pay

## 2018-06-21 DIAGNOSIS — E785 Hyperlipidemia, unspecified: Secondary | ICD-10-CM | POA: Diagnosis not present

## 2018-06-21 DIAGNOSIS — Z8249 Family history of ischemic heart disease and other diseases of the circulatory system: Secondary | ICD-10-CM | POA: Diagnosis not present

## 2018-06-21 DIAGNOSIS — Z6831 Body mass index (BMI) 31.0-31.9, adult: Secondary | ICD-10-CM | POA: Diagnosis not present

## 2018-06-21 DIAGNOSIS — Z880 Allergy status to penicillin: Secondary | ICD-10-CM | POA: Diagnosis not present

## 2018-06-21 DIAGNOSIS — Z8711 Personal history of peptic ulcer disease: Secondary | ICD-10-CM | POA: Insufficient documentation

## 2018-06-21 DIAGNOSIS — Z803 Family history of malignant neoplasm of breast: Secondary | ICD-10-CM | POA: Insufficient documentation

## 2018-06-21 DIAGNOSIS — Z96651 Presence of right artificial knee joint: Secondary | ICD-10-CM

## 2018-06-21 DIAGNOSIS — Z823 Family history of stroke: Secondary | ICD-10-CM | POA: Diagnosis not present

## 2018-06-21 DIAGNOSIS — M1711 Unilateral primary osteoarthritis, right knee: Principal | ICD-10-CM | POA: Insufficient documentation

## 2018-06-21 DIAGNOSIS — Z79891 Long term (current) use of opiate analgesic: Secondary | ICD-10-CM | POA: Diagnosis not present

## 2018-06-21 DIAGNOSIS — E669 Obesity, unspecified: Secondary | ICD-10-CM | POA: Diagnosis not present

## 2018-06-21 DIAGNOSIS — I10 Essential (primary) hypertension: Secondary | ICD-10-CM | POA: Insufficient documentation

## 2018-06-21 DIAGNOSIS — G8918 Other acute postprocedural pain: Secondary | ICD-10-CM | POA: Diagnosis not present

## 2018-06-21 HISTORY — PX: PARTIAL KNEE ARTHROPLASTY: SHX2174

## 2018-06-21 LAB — TYPE AND SCREEN
ABO/RH(D): A POS
Antibody Screen: NEGATIVE

## 2018-06-21 SURGERY — ARTHROPLASTY, KNEE, UNICOMPARTMENTAL
Anesthesia: Spinal | Site: Knee | Laterality: Right

## 2018-06-21 MED ORDER — PHENOL 1.4 % MT LIQD: 1.0000 | OROMUCOSAL | Status: DC | PRN

## 2018-06-21 MED ORDER — MENTHOL 3 MG MT LOZG: 1.0000 | LOZENGE | OROMUCOSAL | Status: DC | PRN

## 2018-06-21 MED ORDER — BUPIVACAINE-EPINEPHRINE (PF) 0.25% -1:200000 IJ SOLN
INTRAMUSCULAR | Status: AC
  Filled 2018-06-21: qty 30

## 2018-06-21 MED ORDER — LIDOCAINE 2% (20 MG/ML) 5 ML SYRINGE
INTRAMUSCULAR | Status: AC
  Filled 2018-06-21: qty 5

## 2018-06-21 MED ORDER — LIDOCAINE HCL (PF) 2 % IJ SOLN
INTRAMUSCULAR | Status: DC | PRN
  Administered 2018-06-21: 40 mg via INTRADERMAL

## 2018-06-21 MED ORDER — ASPIRIN 81 MG PO CHEW: 81.0000 mg | CHEWABLE_TABLET | Freq: Two times a day (BID) | ORAL | 0 refills | Status: AC

## 2018-06-21 MED ORDER — POLYETHYLENE GLYCOL 3350 17 G PO PACK
17.0000 g | PACK | Freq: Two times a day (BID) | ORAL | Status: DC
  Administered 2018-06-22: 17 g via ORAL
  Filled 2018-06-21 (×2): qty 1

## 2018-06-21 MED ORDER — PHENYLEPHRINE HCL 10 MG/ML IJ SOLN
INTRAMUSCULAR | Status: AC
  Filled 2018-06-21: qty 1

## 2018-06-21 MED ORDER — ALUM & MAG HYDROXIDE-SIMETH 200-200-20 MG/5ML PO SUSP: 15.0000 mL | ORAL | Status: DC | PRN

## 2018-06-21 MED ORDER — KETOROLAC TROMETHAMINE 30 MG/ML IJ SOLN
INTRAMUSCULAR | Status: DC | PRN
  Administered 2018-06-21: 30 mg

## 2018-06-21 MED ORDER — BUPIVACAINE-EPINEPHRINE 0.25% -1:200000 IJ SOLN
INTRAMUSCULAR | Status: DC | PRN
  Administered 2018-06-21: 30 mL

## 2018-06-21 MED ORDER — CEFAZOLIN SODIUM-DEXTROSE 2-4 GM/100ML-% IV SOLN
2.0000 g | Freq: Four times a day (QID) | INTRAVENOUS | Status: AC
  Administered 2018-06-21 (×2): 2 g via INTRAVENOUS
  Filled 2018-06-21 (×2): qty 100

## 2018-06-21 MED ORDER — LACTATED RINGERS IV SOLN
INTRAVENOUS | Status: DC
  Administered 2018-06-21 (×3): via INTRAVENOUS

## 2018-06-21 MED ORDER — METOCLOPRAMIDE HCL 5 MG/ML IJ SOLN: 5.0000 mg | Freq: Three times a day (TID) | INTRAMUSCULAR | Status: DC | PRN

## 2018-06-21 MED ORDER — DEXAMETHASONE SODIUM PHOSPHATE 10 MG/ML IJ SOLN
10.0000 mg | Freq: Once | INTRAMUSCULAR | Status: AC
  Administered 2018-06-22: 10 mg via INTRAVENOUS
  Filled 2018-06-21: qty 1

## 2018-06-21 MED ORDER — DEXAMETHASONE SODIUM PHOSPHATE 10 MG/ML IJ SOLN
INTRAMUSCULAR | Status: AC
  Filled 2018-06-21: qty 1

## 2018-06-21 MED ORDER — GLYCOPYRROLATE PF 0.2 MG/ML IJ SOSY
PREFILLED_SYRINGE | INTRAMUSCULAR | Status: AC
  Filled 2018-06-21: qty 1

## 2018-06-21 MED ORDER — PROPOFOL 10 MG/ML IV BOLUS
INTRAVENOUS | Status: AC
  Filled 2018-06-21: qty 60

## 2018-06-21 MED ORDER — SODIUM CHLORIDE 0.9 % IJ SOLN
INTRAMUSCULAR | Status: DC | PRN
  Administered 2018-06-21: 30 mL

## 2018-06-21 MED ORDER — SODIUM CHLORIDE 0.9 % IR SOLN
Status: DC | PRN
  Administered 2018-06-21: 1000 mL

## 2018-06-21 MED ORDER — SODIUM CHLORIDE 0.9 % IJ SOLN
INTRAMUSCULAR | Status: AC
  Filled 2018-06-21: qty 50

## 2018-06-21 MED ORDER — BISACODYL 10 MG RE SUPP: 10.0000 mg | Freq: Every day | RECTAL | Status: DC | PRN

## 2018-06-21 MED ORDER — ASPIRIN 81 MG PO CHEW
81.0000 mg | CHEWABLE_TABLET | Freq: Two times a day (BID) | ORAL | Status: DC
  Administered 2018-06-21 – 2018-06-22 (×2): 81 mg via ORAL
  Filled 2018-06-21 (×2): qty 1

## 2018-06-21 MED ORDER — POLYETHYLENE GLYCOL 3350 17 G PO PACK: 17.0000 g | PACK | Freq: Two times a day (BID) | ORAL | 0 refills | Status: DC

## 2018-06-21 MED ORDER — MIDAZOLAM HCL 2 MG/2ML IJ SOLN
INTRAMUSCULAR | Status: AC
  Filled 2018-06-21: qty 2

## 2018-06-21 MED ORDER — CHLORHEXIDINE GLUCONATE 4 % EX LIQD: 60.0000 mL | Freq: Once | CUTANEOUS | Status: DC

## 2018-06-21 MED ORDER — FENTANYL CITRATE (PF) 100 MCG/2ML IJ SOLN
INTRAMUSCULAR | Status: DC | PRN
  Administered 2018-06-21 (×2): 50 ug via INTRAVENOUS

## 2018-06-21 MED ORDER — PROPOFOL 10 MG/ML IV BOLUS
INTRAVENOUS | Status: AC
  Filled 2018-06-21: qty 20

## 2018-06-21 MED ORDER — MAGNESIUM CITRATE PO SOLN: 1.0000 | Freq: Once | ORAL | Status: DC | PRN

## 2018-06-21 MED ORDER — FENTANYL CITRATE (PF) 100 MCG/2ML IJ SOLN: 25.0000 ug | INTRAMUSCULAR | Status: DC | PRN

## 2018-06-21 MED ORDER — FENTANYL CITRATE (PF) 100 MCG/2ML IJ SOLN
INTRAMUSCULAR | Status: AC
  Filled 2018-06-21: qty 2

## 2018-06-21 MED ORDER — SODIUM CHLORIDE 0.9 % IV SOLN
INTRAVENOUS | Status: DC
  Administered 2018-06-21 – 2018-06-22 (×2): via INTRAVENOUS

## 2018-06-21 MED ORDER — ONDANSETRON HCL 4 MG/2ML IJ SOLN
INTRAMUSCULAR | Status: AC
  Filled 2018-06-21: qty 2

## 2018-06-21 MED ORDER — TRANEXAMIC ACID 1000 MG/10ML IV SOLN
1000.0000 mg | INTRAVENOUS | Status: AC
  Administered 2018-06-21: 1000 mg via INTRAVENOUS
  Filled 2018-06-21: qty 10

## 2018-06-21 MED ORDER — ONDANSETRON HCL 4 MG/2ML IJ SOLN
INTRAMUSCULAR | Status: DC | PRN
  Administered 2018-06-21: 4 mg via INTRAVENOUS

## 2018-06-21 MED ORDER — DEXAMETHASONE SODIUM PHOSPHATE 10 MG/ML IJ SOLN
10.0000 mg | Freq: Once | INTRAMUSCULAR | Status: AC
  Administered 2018-06-21: 10 mg via INTRAVENOUS

## 2018-06-21 MED ORDER — CELECOXIB 200 MG PO CAPS
200.0000 mg | ORAL_CAPSULE | Freq: Two times a day (BID) | ORAL | Status: DC
  Administered 2018-06-21 – 2018-06-22 (×2): 200 mg via ORAL
  Filled 2018-06-21 (×2): qty 1

## 2018-06-21 MED ORDER — KETOROLAC TROMETHAMINE 30 MG/ML IJ SOLN
INTRAMUSCULAR | Status: AC
  Filled 2018-06-21: qty 1

## 2018-06-21 MED ORDER — GLYCOPYRROLATE 0.2 MG/ML IJ SOLN
INTRAMUSCULAR | Status: DC | PRN
  Administered 2018-06-21: 0.1 mg via INTRAVENOUS

## 2018-06-21 MED ORDER — HYDROCODONE-ACETAMINOPHEN 7.5-325 MG PO TABS: 1.0000 | ORAL_TABLET | ORAL | 0 refills | Status: DC | PRN

## 2018-06-21 MED ORDER — METHOCARBAMOL 500 MG PO TABS
500.0000 mg | ORAL_TABLET | Freq: Four times a day (QID) | ORAL | Status: DC | PRN
  Administered 2018-06-21 (×2): 500 mg via ORAL
  Filled 2018-06-21 (×2): qty 1

## 2018-06-21 MED ORDER — ONDANSETRON HCL 4 MG PO TABS: 4.0000 mg | ORAL_TABLET | Freq: Four times a day (QID) | ORAL | Status: DC | PRN

## 2018-06-21 MED ORDER — DOCUSATE SODIUM 100 MG PO CAPS
100.0000 mg | ORAL_CAPSULE | Freq: Two times a day (BID) | ORAL | Status: DC
  Administered 2018-06-21 – 2018-06-22 (×2): 100 mg via ORAL
  Filled 2018-06-21 (×2): qty 1

## 2018-06-21 MED ORDER — FERROUS SULFATE 325 (65 FE) MG PO TABS: 325.0000 mg | ORAL_TABLET | Freq: Three times a day (TID) | ORAL | 3 refills | Status: DC

## 2018-06-21 MED ORDER — FERROUS SULFATE 325 (65 FE) MG PO TABS
325.0000 mg | ORAL_TABLET | Freq: Two times a day (BID) | ORAL | Status: DC
  Administered 2018-06-22: 325 mg via ORAL
  Filled 2018-06-21: qty 1

## 2018-06-21 MED ORDER — DIPHENHYDRAMINE HCL 12.5 MG/5ML PO ELIX: 12.5000 mg | ORAL_SOLUTION | ORAL | Status: DC | PRN

## 2018-06-21 MED ORDER — METHOCARBAMOL 500 MG PO TABS: 500.0000 mg | ORAL_TABLET | Freq: Four times a day (QID) | ORAL | 0 refills | Status: DC | PRN

## 2018-06-21 MED ORDER — ACETAMINOPHEN 325 MG PO TABS: 325.0000 mg | ORAL_TABLET | Freq: Four times a day (QID) | ORAL | Status: DC | PRN

## 2018-06-21 MED ORDER — MIDAZOLAM HCL 5 MG/5ML IJ SOLN
INTRAMUSCULAR | Status: DC | PRN
  Administered 2018-06-21: 2 mg via INTRAVENOUS

## 2018-06-21 MED ORDER — BUPIVACAINE IN DEXTROSE 0.75-8.25 % IT SOLN
INTRATHECAL | Status: DC | PRN
  Administered 2018-06-21: 2 mL via INTRATHECAL

## 2018-06-21 MED ORDER — MORPHINE SULFATE (PF) 2 MG/ML IV SOLN: 0.5000 mg | INTRAVENOUS | Status: DC | PRN

## 2018-06-21 MED ORDER — CEFAZOLIN SODIUM-DEXTROSE 2-4 GM/100ML-% IV SOLN
2.0000 g | INTRAVENOUS | Status: AC
  Administered 2018-06-21: 2 g via INTRAVENOUS
  Filled 2018-06-21: qty 100

## 2018-06-21 MED ORDER — SODIUM CHLORIDE 0.9 % IV SOLN
INTRAVENOUS | Status: DC | PRN
  Administered 2018-06-21: 25 ug/min via INTRAVENOUS

## 2018-06-21 MED ORDER — HYDROCODONE-ACETAMINOPHEN 7.5-325 MG PO TABS
1.0000 | ORAL_TABLET | ORAL | Status: DC | PRN
  Administered 2018-06-21 – 2018-06-22 (×2): 2 via ORAL
  Filled 2018-06-21 (×2): qty 2

## 2018-06-21 MED ORDER — ROPIVACAINE HCL 7.5 MG/ML IJ SOLN
INTRAMUSCULAR | Status: DC | PRN
  Administered 2018-06-21: 20 mL via PERINEURAL

## 2018-06-21 MED ORDER — TRANEXAMIC ACID 1000 MG/10ML IV SOLN
1000.0000 mg | Freq: Once | INTRAVENOUS | Status: AC
  Administered 2018-06-21: 1000 mg via INTRAVENOUS
  Filled 2018-06-21: qty 1000
  Filled 2018-06-21: qty 10

## 2018-06-21 MED ORDER — HYDROCODONE-ACETAMINOPHEN 5-325 MG PO TABS
1.0000 | ORAL_TABLET | ORAL | Status: DC | PRN
  Administered 2018-06-21: 2 via ORAL
  Filled 2018-06-21: qty 2

## 2018-06-21 MED ORDER — METHOCARBAMOL 500 MG IVPB - SIMPLE MED
500.0000 mg | Freq: Four times a day (QID) | INTRAVENOUS | Status: DC | PRN
  Filled 2018-06-21: qty 50

## 2018-06-21 MED ORDER — ONDANSETRON HCL 4 MG/2ML IJ SOLN
4.0000 mg | Freq: Four times a day (QID) | INTRAMUSCULAR | Status: DC | PRN
  Administered 2018-06-21 (×2): 4 mg via INTRAVENOUS
  Filled 2018-06-21: qty 2

## 2018-06-21 MED ORDER — METOCLOPRAMIDE HCL 5 MG PO TABS: 5.0000 mg | ORAL_TABLET | Freq: Three times a day (TID) | ORAL | Status: DC | PRN

## 2018-06-21 MED ORDER — CLONIDINE HCL (ANALGESIA) 100 MCG/ML EP SOLN
EPIDURAL | Status: DC | PRN
  Administered 2018-06-21: 50 ug

## 2018-06-21 MED ORDER — DOCUSATE SODIUM 100 MG PO CAPS: 100.0000 mg | ORAL_CAPSULE | Freq: Two times a day (BID) | ORAL | 0 refills | Status: DC

## 2018-06-21 MED ORDER — PROPOFOL 500 MG/50ML IV EMUL
INTRAVENOUS | Status: DC | PRN
  Administered 2018-06-21: 100 ug/kg/min via INTRAVENOUS

## 2018-06-21 SURGICAL SUPPLY — 51 items
BAG DECANTER FOR FLEXI CONT (MISCELLANEOUS) IMPLANT
BAG ZIPLOCK 12X15 (MISCELLANEOUS) IMPLANT
BANDAGE ACE 6X5 VEL STRL LF (GAUZE/BANDAGES/DRESSINGS) ×2 IMPLANT
BEARING TIB MENISCAL RT SM 4MM (Joint) ×1 IMPLANT
BLADE SAW RECIPROCATING 77.5 (BLADE) ×2 IMPLANT
BLADE SAW SGTL 13.0X1.19X90.0M (BLADE) ×2 IMPLANT
BOWL SMART MIX CTS (DISPOSABLE) ×2 IMPLANT
CEMENT BONE R 1X40 (Cement) ×2 IMPLANT
COVER SURGICAL LIGHT HANDLE (MISCELLANEOUS) ×2 IMPLANT
CUFF TOURN SGL QUICK 34 (TOURNIQUET CUFF) ×1
CUFF TRNQT CYL 34X4X40X1 (TOURNIQUET CUFF) ×1 IMPLANT
DECANTER SPIKE VIAL GLASS SM (MISCELLANEOUS) IMPLANT
DERMABOND ADVANCED (GAUZE/BANDAGES/DRESSINGS) ×1
DERMABOND ADVANCED .7 DNX12 (GAUZE/BANDAGES/DRESSINGS) ×1 IMPLANT
DRAPE U-SHAPE 47X51 STRL (DRAPES) ×2 IMPLANT
DRESSING AQUACEL AG SP 3.5X10 (GAUZE/BANDAGES/DRESSINGS) ×1 IMPLANT
DRSG AQUACEL AG SP 3.5X10 (GAUZE/BANDAGES/DRESSINGS) ×2
DURAPREP 26ML APPLICATOR (WOUND CARE) ×4 IMPLANT
ELECT REM PT RETURN 15FT ADLT (MISCELLANEOUS) ×2 IMPLANT
GLOVE BIOGEL M 7.0 STRL (GLOVE) IMPLANT
GLOVE BIOGEL PI IND STRL 7.0 (GLOVE) ×4 IMPLANT
GLOVE BIOGEL PI IND STRL 7.5 (GLOVE) ×1 IMPLANT
GLOVE BIOGEL PI IND STRL 8.5 (GLOVE) ×1 IMPLANT
GLOVE BIOGEL PI INDICATOR 7.0 (GLOVE) ×4
GLOVE BIOGEL PI INDICATOR 7.5 (GLOVE) ×1
GLOVE BIOGEL PI INDICATOR 8.5 (GLOVE) ×1
GLOVE ECLIPSE 8.0 STRL XLNG CF (GLOVE) ×4 IMPLANT
GLOVE ORTHO TXT STRL SZ7.5 (GLOVE) ×4 IMPLANT
GOWN STRL REUS W/TWL 2XL LVL3 (GOWN DISPOSABLE) ×2 IMPLANT
GOWN STRL REUS W/TWL LRG LVL3 (GOWN DISPOSABLE) ×2 IMPLANT
GOWN STRL REUS W/TWL XL LVL3 (GOWN DISPOSABLE) ×2 IMPLANT
HOLDER FOLEY CATH W/STRAP (MISCELLANEOUS) IMPLANT
LEGGING LITHOTOMY PAIR STRL (DRAPES) ×2 IMPLANT
MANIFOLD NEPTUNE II (INSTRUMENTS) ×2 IMPLANT
NDL SAFETY ECLIPSE 18X1.5 (NEEDLE) IMPLANT
NEEDLE HYPO 18GX1.5 SHARP (NEEDLE)
PACK TOTAL KNEE CUSTOM (KITS) ×2 IMPLANT
PEG FEMORAL PEGGED STRL SM (Knees) ×2 IMPLANT
SUT MNCRL AB 4-0 PS2 18 (SUTURE) ×2 IMPLANT
SUT STRATAFIX 0 PDS 27 VIOLET (SUTURE) ×2
SUT VIC AB 1 CT1 36 (SUTURE) ×2 IMPLANT
SUT VIC AB 2-0 CT1 27 (SUTURE) ×2
SUT VIC AB 2-0 CT1 TAPERPNT 27 (SUTURE) ×2 IMPLANT
SUTURE STRATFX 0 PDS 27 VIOLET (SUTURE) ×1 IMPLANT
SYR 3ML LL SCALE MARK (SYRINGE) IMPLANT
SYR 50ML LL SCALE MARK (SYRINGE) ×2 IMPLANT
TRAY FOLEY CATH 14FR (SET/KITS/TRAYS/PACK) ×2 IMPLANT
TRAY TIBIAL KNEE OXFORD SZAA (Joint) ×2 IMPLANT
UNICOMPARTMENTAL KNEE MENISCAL (Joint) ×2 IMPLANT
WRAP KNEE MAXI GEL POST OP (GAUZE/BANDAGES/DRESSINGS) ×2 IMPLANT
YANKAUER SUCT BULB TIP NO VENT (SUCTIONS) ×2 IMPLANT

## 2018-06-21 NOTE — Discharge Instructions (Signed)

## 2018-06-21 NOTE — Interval H&P Note (Signed)
History and Physical Interval Note:  06/21/2018 7:07 AM  Lometa  has presented today for surgery, with the diagnosis of Right knee medial compartmental degenerative joint disease  The various methods of treatment have been discussed with the patient and family. After consideration of risks, benefits and other options for treatment, the patient has consented to  Procedure(s) with comments: RIGHT UNICOMPARTMENTAL KNEE-MEDIALLY (Right) - 70 mins as a surgical intervention .  The patient's history has been reviewed, patient examined, no change in status, stable for surgery.  I have reviewed the patient's chart and labs.  Questions were answered to the patient's satisfaction.     Mauri Pole

## 2018-06-21 NOTE — Anesthesia Postprocedure Evaluation (Signed)
Anesthesia Post Note  Patient: Sarah Savage  Procedure(s) Performed: RIGHT UNICOMPARTMENTAL KNEE-MEDIALLY (Right Knee)     Patient location during evaluation: PACU Anesthesia Type: Spinal Level of consciousness: awake and alert Pain management: pain level controlled Vital Signs Assessment: post-procedure vital signs reviewed and stable Respiratory status: spontaneous breathing and respiratory function stable Cardiovascular status: blood pressure returned to baseline and stable Postop Assessment: spinal receding Anesthetic complications: no    Last Vitals:  Vitals:   06/21/18 1015 06/21/18 1101  BP:  122/87  Pulse:  (!) 42  Resp:  18  Temp: 36.6 C   SpO2:  100%    Last Pain:  Vitals:   06/21/18 1015  TempSrc:   PainSc: 0-No pain                 Tiajuana Amass

## 2018-06-21 NOTE — Anesthesia Procedure Notes (Signed)
Procedure Name: MAC Date/Time: 06/21/2018 7:18 AM Performed by: Lissa Morales, CRNA Pre-anesthesia Checklist: Patient identified, Emergency Drugs available, Patient being monitored, Suction available and Timeout performed Patient Re-evaluated:Patient Re-evaluated prior to induction Oxygen Delivery Method: Simple face mask Placement Confirmation: positive ETCO2

## 2018-06-21 NOTE — Evaluation (Signed)
Physical Therapy Evaluation Patient Details Name: Sarah Savage MRN: [id number] DOB: May 20, 1961 Today's Date: 06/21/2018   History of Present Illness  Pt is a 57 YO female s/p R medial UKR on 06/21/18. PMH includes hyperglycemia, HLD, HTN, plantar fasciitis. Surgical history includes bunionectomy, ulcer excision from stomach.   Clinical Impression   Pt presents with R knee pain, decreased RLE ROM, decreased activity tolerance for ambulation, and RLE weakness. Pt would benefit from acute PT to address deficits. Pt ambulated in hallway for 60 ft with RW and min guard assist. Will continue to progress mobility as able. Pt's d/c plan as outlined below.     Follow Up Recommendations Follow surgeon's recommendation for DC plan and follow-up therapies;Supervision for mobility/OOB(OPPT)    Equipment Recommendations  Cane(Will need crutches or cane for stair navigation, depending on progress tomorrow)    Recommendations for Other Services       Precautions / Restrictions Precautions Precautions: Fall Restrictions Weight Bearing Restrictions: No Other Position/Activity Restrictions: WBAT       Mobility  Bed Mobility Overal bed mobility: Needs Assistance Bed Mobility: Supine to Sit     Supine to sit: Min assist     General bed mobility comments: Min assist for RLE management, scooting in bed. Verbal cuing for using LLE to scoot to EOB.   Transfers Overall transfer level: Needs assistance Equipment used: Rolling walker (2 wheeled) Transfers: Sit to/from Stand Sit to Stand: Min assist         General transfer comment: Min assist for steadying upon standing. Increased time and effort to transfer from sit to stand. Relies on RW for balance once standing. Weight shifting L and R without R knee buckling.   Ambulation/Gait Ambulation/Gait assistance: Min guard Gait Distance (Feet): 60 Feet Assistive device: Rolling walker (2 wheeled) Gait Pattern/deviations: Step-to  pattern;Decreased stride length;Narrow base of support;Antalgic;Decreased stance time - right;Decreased weight shift to right Gait velocity: decreased    General Gait Details: Min guard for safety. Verbal cuing for sequencing (RW, RLE, LLE). Increased time to turn with RW.   Stairs            Wheelchair Mobility    Modified Rankin (Stroke Patients Only)       Balance Overall balance assessment: Mild deficits observed, not formally tested                                           Pertinent Vitals/Pain Pain Assessment: 0-10 Pain Score: 5  Pain Location: R knee  Pain Descriptors / Indicators: Aching Pain Intervention(s): Limited activity within patient's tolerance;Ice applied;Monitored during session;Patient requesting pain meds-RN notified    Home Living Family/patient expects to be discharged to:: Private residence Living Arrangements: Spouse/significant other Available Help at Discharge: Family;Available PRN/intermittently Type of Home: House Home Access: Stairs to enter Entrance Stairs-Rails: None Entrance Stairs-Number of Steps: 3 Home Layout: One level Home Equipment: Environmental consultant - 2 wheels      Prior Function Level of Independence: Independent               Hand Dominance   Dominant Hand: Right    Extremity/Trunk Assessment   Upper Extremity Assessment Upper Extremity Assessment: Overall WFL for tasks assessed    Lower Extremity Assessment Lower Extremity Assessment: Overall WFL for tasks assessed;RLE deficits/detail RLE Deficits / Details: Suspected post-surgical weakness of RLE; inconsistently able to perform quad set x5,  ankle pumps x10 RLE Sensation: decreased light touch(decreased light touch of medial and lateral lower leg )    Cervical / Trunk Assessment Cervical / Trunk Assessment: Normal  Communication   Communication: No difficulties  Cognition Arousal/Alertness: Awake/alert Behavior During Therapy: WFL for tasks  assessed/performed Overall Cognitive Status: Within Functional Limits for tasks assessed                                        General Comments      Exercises Total Joint Exercises Ankle Circles/Pumps: AROM;Both;10 reps;Supine Quad Sets: AROM;Right;5 reps;Supine   Assessment/Plan    PT Assessment Patient needs continued PT services  PT Problem List Decreased strength;Pain;Decreased range of motion;Decreased activity tolerance;Decreased knowledge of use of DME;Decreased balance;Decreased mobility       PT Treatment Interventions DME instruction;Therapeutic activities;Gait training;Therapeutic exercise;Patient/family education;Stair training;Balance training;Functional mobility training    PT Goals (Current goals can be found in the Care Plan section)  Acute Rehab PT Goals PT Goal Formulation: With patient Time For Goal Achievement: 07/05/18 Potential to Achieve Goals: Good    Frequency 7X/week   Barriers to discharge        Co-evaluation               AM-PAC PT "6 Clicks" Daily Activity  Outcome Measure Difficulty turning over in bed (including adjusting bedclothes, sheets and blankets)?: Unable Difficulty moving from lying on back to sitting on the side of the bed? : Unable Difficulty sitting down on and standing up from a chair with arms (e.g., wheelchair, bedside commode, etc,.)?: Unable Help needed moving to and from a bed to chair (including a wheelchair)?: A Little Help needed walking in hospital room?: A Little Help needed climbing 3-5 steps with a railing? : A Lot 6 Click Score: 11    End of Session Equipment Utilized During Treatment: Gait belt Activity Tolerance: Patient tolerated treatment well;Patient limited by pain Patient left: in chair;with chair alarm set;with call bell/phone within reach;with family/visitor present Nurse Communication: Mobility status;Patient requests pain meds PT Visit Diagnosis: Difficulty in walking, not  elsewhere classified (R26.2);Unsteadiness on feet (R26.81)    Time: 9892-1194 PT Time Calculation (min) (ACUTE ONLY): 33 min   Charges:   PT Evaluation $PT Eval Low Complexity: 1 Low PT Treatments $Gait Training: 8-22 mins       Renald Haithcock Conception Chancy, PT, DPT  Pager # [phone]    Graylen Noboa D Forever Arechiga 06/21/2018, 5:11 PM

## 2018-06-21 NOTE — Op Note (Signed)
NAME: Sarah Savage    MEDICAL RECORD NO.: [id number]   FACILITY: [facility] BIRTH: June 04, 1961  PHYSICIAN: Pietro Cassis. Alvan Dame, M.D.    DATE OF PROCEDURE: 06/21/2018    OPERATIVE REPORT   PREOPERATIVE DIAGNOSIS: Right knee medial compartment osteoarthritis.   POSTOPERATIVE DIAGNOSIS: Right knee medial compartment osteoarthritis.  PROCEDURE: Right partial knee replacement utilizing Biomet Oxford knee  component, size small femur, a right medial size AA tibial tray with a size 4 mm insert.   SURGEON: Pietro Cassis. Alvan Dame, M.D.   ASSISTANT: Danae Orleans, PAC.  Please note that Mr. Sarah Savage was present for the entirety of the case,  utilized for preoperative positioning, perioperative retractor  management, general facilitation of the case and primary wound closure.   ANESTHESIA: Regional and spinal.   SPECIMENS: None.   COMPLICATIONS: None.  DRAINS: None   TOURNIQUET TIME: 33 minutes at 250 mmHg.   INDICATIONS FOR PROCEDURE: The patient is a 57 y.o. patient of mine who presented for evaluation of right knee pain.  They presented with primary complaints of pain on the medial side of their knee. Radiographs revealed advanced medial compartment arthritis with specifically an antero-medial wear pattern.  There was bone on bone changes noted with subchondral sclerosis and osteophytes present. The patient has had progressive problems failing to respond to conservative measures of medications, injections and activity modification. Risks of infection, DVT, component failure, need for future revision surgery were all discussed and reviewed.  Consent was obtained for benefit of pain relief.   PROCEDURE IN DETAIL: The patient was brought to the operative theater.  Once adequate anesthesia, preoperative antibiotics, 2gm Ancef, 1 gm of Tranexamic Acid, and 10 mg of Decadron administered, the patient was positioned in supine position with a right thigh tourniquet  placed. The right lower  extremity was prepped and draped in sterile  fashion with the leg on the Oxford leg holder.  The leg was allowed to flex to 120 degrees. A time-out  was performed identifying the patient, planned procedure, and extremity.  The leg was exsanguinated, tourniquet elevated to 250 mmHg. A midline  incision was made from the proximal pole of the patella to the tibial tubercle. A  soft tissue plane was created and partial median arthrotomy was then  made to allow for subluxation of the patella. Following initial synovectomy and  debridement, the osteophytes were removed off the medial aspect of the  knee.   Attention was first directed to the tibia. The tibial  extramedullary guide was positioned over the anterior crest of the tibia  and pinned into position, and using a measured resection guide from the  [place] system, a 4 mm resection was made off the proximal tibia. First  the reciprocating saw along the medial aspect of the tibial spines, then the oscillating saw.    At this point, I sized this cut surface seem to be best fit for a size AA tibial tray.  With the retractors out of the wound and the knee held at 90 degrees the 4 feeler gauge had appropriate tension on the medial ligament.   At this point, the femoral canal was opened with a drill and the  intramedullary rod passed. Then the small posterior cutting block guide was positioned over the mid portion of the medial femoral condyle.  The orientation was set using the articulating guide that mates the femoral guide to the intramedullary rod.  The 2 drill holes were made into the distal femur.  The  small posterior guide was then impacted into place and the posterior  femoral cut made.  At this point, I milled the distal femur with a size 4 spigot in place. At this point, we did a trial reduction of the small femur, size AA tibial tray.  At 90 degrees of flexion the 4 feeler gauge had appropriate tension on the MCL but at 20 degrees the 3  feeler gauge had appropriate tension on the MCL ligament.  Given the difference in the tension between the knee in 90 degrees versus that in 20 degrees I had to place the 5 spigot into the femur and re-mill the distal femur.  Remaining bone was removed and debrided.  I repeated the trial reduction and found that now at both 90 degrees and 20 degrees the knee ligament were tension symmetrically with the 4 feeler gauge.  Given these findings, the trial femoral component was removed. Final preparation of tibia was carried out by pinning it in position. Then  using a reciprocating saw I removed bone for the keel. Further bone was  removed with an osteotome.  Trial reduction was now carried out with the small femur, the right medial keeled AA tibia, and the 4 lollipop insert. The balance of the  ligaments appeared to be symmetric at 20 degrees and 90 degrees. Given  all these findings, the trial components were removed.   Cement was mixed. The final components were opened. The knee was irrigated with  normal saline solution. The medial soft tissues was injected with 30cc of Marcaine with epinephrine, 1 cc of Toradol, and 30 cc of NS.  Then final debridements of the  soft tissue was carried out, I also drilled the sclerotic bone with a drill.  The final components were cemented with a single batch of cement in a  two-stage technique with the tibial component cemented first. The knee  was then brought  to 45 degrees of flexion with a 4 feeler gauge, held with pressure for a minute and half.  After this the femoral component was cemented in place.  The knee was again held at 45 degrees of flexion while the cement fully cured.  Excess cement was removed throughout the knee. Tourniquet was let down  after 33 minutes. After the cement had fully cured and excessive cement  was removed throughout the knee there was no visualized cement present.   The final right size 4 mm insert to match the small femur was  chosen and snapped into position. We re-irrigated  the knee. The extensor mechanism  was then reapproximated using a #1 Vicryl with the knee in flexion. The  remaining wound was closed with 2-0 Vicryl and a running 4-0 Monocryl.  The knee was cleaned, dried, and dressed sterilely using Dermabond and  Aquacel dressing. The patient  was brought to the recovery room, Ace wrap in place, tolerating the  procedure well. She will be in the hospital for overnight observation.  We will initiate physical therapy and progress to ambulate.     Pietro Cassis Alvan Dame, M.D.

## 2018-06-21 NOTE — Anesthesia Procedure Notes (Signed)
Anesthesia Regional Block: Adductor canal block   Pre-Anesthetic Checklist: ,, timeout performed, Correct Patient, Correct Site, Correct Laterality, Correct Procedure, Correct Position, site marked, Risks and benefits discussed,  Surgical consent,  Pre-op evaluation,  At surgeon's request and post-op pain management  Laterality: Right  Prep: chloraprep       Needles:  Injection technique: Single-shot  Needle Type: Echogenic Needle     Needle Length: 9cm  Needle Gauge: 21     Additional Needles:   Procedures:,,,, ultrasound used (permanent image in chart),,,,  Narrative:  Start time: 06/21/2018 6:58 AM End time: 06/21/2018 7:05 AM Injection made incrementally with aspirations every 5 mL.  Performed by: Personally  Anesthesiologist: Suzette Battiest, MD

## 2018-06-21 NOTE — Anesthesia Procedure Notes (Signed)
Spinal  Patient location during procedure: OR Start time: 06/21/2018 7:23 AM End time: 06/21/2018 7:28 AM Staffing Anesthesiologist: Suzette Battiest, MD Performed: anesthesiologist  Preanesthetic Checklist Completed: patient identified, site marked, surgical consent, pre-op evaluation, timeout performed, IV checked, risks and benefits discussed and monitors and equipment checked Spinal Block Patient position: sitting Prep: site prepped and draped and DuraPrep Patient monitoring: blood pressure, continuous pulse ox and heart rate Approach: midline Location: L4-5 Injection technique: single-shot Needle Needle type: Pencan  Needle gauge: 24 G Needle length: 9 cm

## 2018-06-21 NOTE — Transfer of Care (Signed)
Immediate Anesthesia Transfer of Care Note  Patient: Sarah Savage  Procedure(s) Performed: RIGHT UNICOMPARTMENTAL KNEE-MEDIALLY (Right Knee)  Patient Location: PACU  Anesthesia Type:Regional and Spinal  Level of Consciousness: awake, alert  and oriented  Airway & Oxygen Therapy: Patient Spontanous Breathing and Patient connected to face mask oxygen  Post-op Assessment: Report given to RN and Post -op Vital signs reviewed and stable  Post vital signs: Reviewed and stable  Last Vitals:  Vitals Value Taken Time  BP 105/60 06/21/2018  9:21 AM  Temp    Pulse 83 06/21/2018  9:24 AM  Resp 15 06/21/2018  9:24 AM  SpO2 97 % 06/21/2018  9:24 AM  Vitals shown include unvalidated device data.  Last Pain:  Vitals:   06/21/18 0545  TempSrc: Oral      Patients Stated Pain Goal: 3 (90/30/09 2330)  Complications: No apparent anesthesia complications

## 2018-06-22 ENCOUNTER — Encounter (HOSPITAL_COMMUNITY): Payer: Self-pay | Admitting: Orthopedic Surgery

## 2018-06-22 DIAGNOSIS — M1711 Unilateral primary osteoarthritis, right knee: Secondary | ICD-10-CM | POA: Diagnosis not present

## 2018-06-22 DIAGNOSIS — E669 Obesity, unspecified: Secondary | ICD-10-CM | POA: Diagnosis present

## 2018-06-22 LAB — BASIC METABOLIC PANEL
Anion gap: 5 (ref 5–15)
BUN: 11 mg/dL (ref 6–20)
CHLORIDE: 109 mmol/L (ref 98–111)
CO2: 28 mmol/L (ref 22–32)
Calcium: 8.6 mg/dL — ABNORMAL LOW (ref 8.9–10.3)
Creatinine, Ser: 0.6 mg/dL (ref 0.44–1.00)
GFR calc Af Amer: >60 mL/min (ref >60)
GFR calc non Af Amer: >60 mL/min (ref >60)
GLUCOSE: 130 mg/dL — AB (ref 70–99)
Potassium: 4.3 mmol/L (ref 3.5–5.1)
Sodium: 142 mmol/L (ref 135–145)

## 2018-06-22 LAB — CBC
HEMATOCRIT: 35.8 % — AB (ref 36.0–46.0)
HEMOGLOBIN: 11.4 g/dL — AB (ref 12.0–15.0)
MCH: 27.3 pg (ref 26.0–34.0)
MCHC: 31.8 g/dL (ref 30.0–36.0)
MCV: 85.6 fL (ref 78.0–100.0)
Platelets: 299 10*3/uL (ref 150–400)
RBC: 4.18 MIL/uL (ref 3.87–5.11)
RDW: 13.6 % (ref 11.5–15.5)
WBC: 10.8 10*3/uL — ABNORMAL HIGH (ref 4.0–10.5)

## 2018-06-22 NOTE — Progress Notes (Signed)
Physical Therapy Treatment Patient Details Name: Sarah Savage MRN: [id number] DOB: November 02, 1960 Today's Date: 06/22/2018    History of Present Illness Pt is a 57 YO female s/p R medial UKR on 06/21/18. PMH includes hyperglycemia, HLD, HTN, plantar fasciitis. Surgical history includes bunionectomy, ulcer excision from stomach.     PT Comments    Progressing with mobility. Reviewed exercises, gait training and stair training. Issued HEP for pt to perform 2x/day until she begins OP PT. All education completed. Okay to d/c from PT standpoint-made RN aware.    Follow Up Recommendations  Follow surgeon's recommendation for DC plan and follow-up therapies;Supervision for mobility/OOB     Equipment Recommendations  3in1 (PT)    Recommendations for Other Services       Precautions / Restrictions Precautions Precautions: Fall Restrictions Weight Bearing Restrictions: No Other Position/Activity Restrictions: WBAT     Mobility  Bed Mobility               General bed mobility comments: oob in recliner  Transfers Overall transfer level: Needs assistance Equipment used: Rolling walker (2 wheeled) Transfers: Sit to/from Stand Sit to Stand: Min guard         General transfer comment: Close guard for safety. Increased time. VCs safety, hand/LE placement.   Ambulation/Gait Ambulation/Gait assistance: Min guard Gait Distance (Feet): 60 Feet Assistive device: Rolling walker (2 wheeled) Gait Pattern/deviations: Step-to pattern;Decreased stride length;Narrow base of support;Antalgic;Decreased stance time - right;Decreased weight shift to right     General Gait Details: Min guard for safety. VCs safety, sequence. Slow gait speed.    Stairs Stairs: Yes Stairs assistance: Min assist Stair Management: Step to pattern;Forwards;With walker Number of Stairs: 2 General stair comments: VCS safety, sequence, technique. Assist to stabilize walker.    Wheelchair Mobility     Modified Rankin (Stroke Patients Only)       Balance                                            Cognition Arousal/Alertness: Awake/alert Behavior During Therapy: WFL for tasks assessed/performed Overall Cognitive Status: Within Functional Limits for tasks assessed                                        Exercises Total Joint Exercises Ankle Circles/Pumps: AROM;Both;10 reps;Supine Quad Sets: AROM;Right;Supine;10 reps Heel Slides: AAROM;Right;10 reps;Supine Hip ABduction/ADduction: AROM;Right;10 reps;Supine Straight Leg Raises: AROM;AAROM;Right;10 reps;Supine Goniometric ROM: ~10-60 degrees    General Comments        Pertinent Vitals/Pain Pain Assessment: 0-10 Pain Score: 6  Pain Location: R knee  Pain Descriptors / Indicators: Aching Pain Intervention(s): Monitored during session    Home Living                      Prior Function            PT Goals (current goals can now be found in the care plan section) Progress towards PT goals: Progressing toward goals    Frequency    7X/week      PT Plan Current plan remains appropriate    Co-evaluation              AM-PAC PT "6 Clicks" Daily Activity  Outcome Measure  Difficulty turning over in bed (including  adjusting bedclothes, sheets and blankets)?: Unable Difficulty moving from lying on back to sitting on the side of the bed? : Unable Difficulty sitting down on and standing up from a chair with arms (e.g., wheelchair, bedside commode, etc,.)?: Unable Help needed moving to and from a bed to chair (including a wheelchair)?: A Little Help needed walking in hospital room?: A Little Help needed climbing 3-5 steps with a railing? : A Lot 6 Click Score: 11    End of Session Equipment Utilized During Treatment: Gait belt Activity Tolerance: Patient tolerated treatment well Patient left: in chair;with call bell/phone within reach   PT Visit Diagnosis:  Difficulty in walking, not elsewhere classified (R26.2);Unsteadiness on feet (R26.81)     Time: 9611-6435 PT Time Calculation (min) (ACUTE ONLY): 27 min  Charges:  $Gait Training: 8-22 mins $Therapeutic Exercise: 8-22 mins                       Weston Anna, MPT Pager: [phone]

## 2018-06-22 NOTE — Progress Notes (Signed)
     Subjective: 1 Day Post-Op Procedure(s) (LRB): RIGHT UNICOMPARTMENTAL KNEE-MEDIALLY (Right)    Patient reports pain as mild, pain controlled. No events throughout the night. Feels that she is doing well and looking forward to recovering.  Ready to be discharged home.    Patient's anticipated LOS is less than 2 midnights, meeting these requirements: - Younger than 34 - Lives within 1 hour of care - Has a competent adult at home to recover with post-op recover - NO history of  - Chronic pain requiring opiods  - Diabetes  - Coronary Artery Disease  - Heart failure  - Heart attack  - Stroke  - DVT/VTE  - Cardiac arrhythmia  - Respiratory Failure/COPD  - Renal failure  - Anemia  - Advanced Liver disease       Objective:   VITALS:   Vitals:   06/22/18 0244 06/22/18 0601  BP: 129/81 (!) 126/93  Pulse: (!) 50 (!) 55  Resp: 16 16  Temp: 97.6 F (36.4 C) (!) 97.5 F (36.4 C)  SpO2: 100% 100%    Dorsiflexion/Plantar flexion intact Incision: dressing C/D/I No cellulitis present Compartment soft  LABS Recent Labs    06/22/18 0446  HGB 11.4*  HCT 35.8*  WBC 10.8*  PLT 299    Recent Labs    06/22/18 0446  NA 142  K 4.3  BUN 11  CREATININE 0.60  GLUCOSE 130*     Assessment/Plan: 1 Day Post-Op Procedure(s) (LRB): RIGHT UNICOMPARTMENTAL KNEE-MEDIALLY (Right) Foley cath d/c'ed Advance diet Up with therapy D/C IV fluids Discharge home Follow up in 2 weeks at [facility] ([place]). Follow up with OLIN,Yaritsa Savarino D in 2 weeks.  Contact information:  [facility]) [address], [place] [phone]    Obese (BMI 30-39.9) Estimated body mass index is 31.09 kg/m as calculated from the following:   Height as of this encounter: 5\' 6"  (1.676 m).   Weight as of this encounter: 87.4 kg. Patient also counseled that weight may inhibit the healing process Patient  counseled that losing weight will help with future health issues      West Pugh. Keyen Marban   PAC  06/22/2018, 8:03 AM

## 2018-06-28 DIAGNOSIS — M25561 Pain in right knee: Secondary | ICD-10-CM | POA: Diagnosis not present

## 2018-06-28 DIAGNOSIS — M25661 Stiffness of right knee, not elsewhere classified: Secondary | ICD-10-CM | POA: Diagnosis not present

## 2018-06-28 DIAGNOSIS — Z96651 Presence of right artificial knee joint: Secondary | ICD-10-CM | POA: Diagnosis not present

## 2018-06-29 NOTE — Discharge Summary (Signed)
Physician Discharge Summary  Patient ID: Sarah Savage MRN: [id number] DOB/AGE: 57-Feb-1962 57 y.o.  Admit date: 06/21/2018 Discharge date: 06/22/2018   Procedures:  Procedure(s) (LRB): RIGHT UNICOMPARTMENTAL KNEE-MEDIALLY (Right)  Attending Physician:  Dr. Paralee Cancel   Admission Diagnoses:   Right knee medial compartmental primary OA /pain  Discharge Diagnoses:  Principal Problem:   S/P right UKR Active Problems:   Obese  Past Medical History:  Diagnosis Date  . Hyperglycemia   . Hyperlipidemia   . Hypertension   . Plantar fasciitis     HPI:    Sarah Savage, 57 y.o. female , has a history of pain and functional disability in the right and has failed non-surgical conservative treatments for greater than 12 weeks to include NSAID's and/or analgesics, corticosteriod injections and activity modification.  Onset of symptoms was gradual, starting 3 years ago with gradually worsening course since that time. The patient noted no past surgery on the right knee(s).  Patient currently rates pain in the right knee(s) at 10 out of 10 with activity. Patient has night pain, worsening of pain with activity and weight bearing, pain that interferes with activities of daily living, pain with passive range of motion, crepitus and joint swelling.  Patient has evidence of periarticular osteophytes and joint space narrowing of the medial compartment by imaging studies.  There is no active infection.  Risks, benefits and expectations were discussed with the patient.  Risks including but not limited to the risk of anesthesia, blood clots, nerve damage, blood vessel damage, failure of the prosthesis, infection and up to and including death.  Patient understand the risks, benefits and expectations and wishes to proceed with surgery.   PCP: Idelle Crouch, MD   Discharged Condition: good  Hospital Course:  Patient underwent the above stated procedure on 06/21/2018. Patient tolerated the  procedure well and brought to the recovery room in good condition and subsequently to the floor.  POD #1 BP: 126/93 ; Pulse: 55 ; Temp: 97.5 F (36.4 C) ; Resp: 16 Patient reports pain as mild, pain controlled. No events throughout the night. Feels that she is doing well and looking forward to recovering.  Ready to be discharged home.  Dorsiflexion/plantar flexion intact, incision: dressing C/D/I, no cellulitis present and compartment soft.   LABS  Basename    HGB     11.4  HCT     35.8    Discharge Exam: General appearance: alert, cooperative and no distress Extremities: Homans sign is negative, no sign of DVT, no edema, redness or tenderness in the calves or thighs and no ulcers, gangrene or trophic changes  Disposition:  Home with follow up in 2 weeks   Follow-up Information    Paralee Cancel, MD. Schedule an appointment as soon as possible for a visit in 2 weeks.   Specialty:  Orthopedic Surgery Contact information: [address] [place] [address] [phone]           Discharge Instructions    Call MD / Call 911   Complete by:  As directed    If you experience chest pain or shortness of breath, CALL 911 and be transported to the hospital emergency room.  If you develope a fever above 101 F, pus (white drainage) or increased drainage or redness at the wound, or calf pain, call your surgeon's office.   Change dressing   Complete by:  As directed    Maintain surgical dressing until follow up in the clinic.  If the edges start to pull up, may reinforce with tape. If the dressing is no longer working, may remove and cover with gauze and tape, but must keep the area dry and clean.  Call with any questions or concerns.   Constipation Prevention   Complete by:  As directed    Drink plenty of fluids.  Prune juice may be helpful.  You may use a stool softener, such as Colace (over the counter) 100 mg twice a day.  Use MiraLax (over the counter) for constipation  as needed.   Diet - low sodium heart healthy   Complete by:  As directed    Discharge instructions   Complete by:  As directed    Maintain surgical dressing until follow up in the clinic. If the edges start to pull up, may reinforce with tape. If the dressing is no longer working, may remove and cover with gauze and tape, but must keep the area dry and clean.  Follow up in 2 weeks at [facility]. Call with any questions or concerns.   Increase activity slowly as tolerated   Complete by:  As directed    Weight bearing as tolerated with assist device (walker, cane, etc) as directed, use it as Syfert as suggested by your surgeon or therapist, typically at least 4-6 weeks.   TED hose   Complete by:  As directed    Use stockings (TED hose) for 2 weeks on both leg(s).  You may remove them at night for sleeping.      Allergies as of 06/22/2018      Reactions   Penicillins Other (See Comments)   "I gives me a yeast infection" Has patient had a PCN reaction causing immediate rash, facial/tongue/throat swelling, SOB or lightheadedness with hypotension: No Has patient had a PCN reaction causing severe rash involving mucus membranes or skin necrosis: No Has patient had a PCN reaction that required hospitalization: No Has patient had a PCN reaction occurring within the last 10 years: No If all of the above answers are "NO", then may proceed with Cephalosporin use.      Medication List    STOP taking these medications   conjugated estrogens vaginal cream Commonly known as:  PREMARIN   traMADol 50 MG tablet Commonly known as:  ULTRAM     TAKE these medications   aspirin 81 MG chewable tablet Chew 1 tablet (81 mg total) by mouth 2 (two) times daily. Take for 4 weeks, then resume regular dose.   docusate sodium 100 MG capsule Commonly known as:  COLACE Take 1 capsule (100 mg total) by mouth 2 (two) times daily.   ferrous sulfate 325 (65 FE) MG tablet Take 1 tablet (325 mg total)  by mouth 3 (three) times daily with meals.   HYDROcodone-acetaminophen 7.5-325 MG tablet Commonly known as:  NORCO Take 1-2 tablets by mouth every 4 (four) hours as needed for moderate pain.   methocarbamol 500 MG tablet Commonly known as:  ROBAXIN Take 1 tablet (500 mg total) by mouth every 6 (six) hours as needed for muscle spasms.   polyethylene glycol packet Commonly known as:  MIRALAX / GLYCOLAX Take 17 g by mouth 2 (two) times daily.            Discharge Care Instructions  (From admission, onward)         Start     Ordered   06/22/18 0000  Change dressing    Comments:  Maintain surgical dressing until follow  up in the clinic. If the edges start to pull up, may reinforce with tape. If the dressing is no longer working, may remove and cover with gauze and tape, but must keep the area dry and clean.  Call with any questions or concerns.   06/22/18 1915           Signed: [place] Pugh. [place]   PA-C  06/29/2018, 3:22 PM

## 2018-06-30 DIAGNOSIS — M25661 Stiffness of right knee, not elsewhere classified: Secondary | ICD-10-CM | POA: Diagnosis not present

## 2018-06-30 DIAGNOSIS — M25561 Pain in right knee: Secondary | ICD-10-CM | POA: Diagnosis not present

## 2018-06-30 DIAGNOSIS — Z96651 Presence of right artificial knee joint: Secondary | ICD-10-CM | POA: Diagnosis not present

## 2018-07-04 DIAGNOSIS — Z96651 Presence of right artificial knee joint: Secondary | ICD-10-CM | POA: Diagnosis not present

## 2018-07-04 DIAGNOSIS — M25661 Stiffness of right knee, not elsewhere classified: Secondary | ICD-10-CM | POA: Diagnosis not present

## 2018-07-04 DIAGNOSIS — M25561 Pain in right knee: Secondary | ICD-10-CM | POA: Diagnosis not present

## 2018-07-06 DIAGNOSIS — M25561 Pain in right knee: Secondary | ICD-10-CM | POA: Diagnosis not present

## 2018-07-06 DIAGNOSIS — M25661 Stiffness of right knee, not elsewhere classified: Secondary | ICD-10-CM | POA: Diagnosis not present

## 2018-07-06 DIAGNOSIS — Z96651 Presence of right artificial knee joint: Secondary | ICD-10-CM | POA: Diagnosis not present

## 2018-07-12 DIAGNOSIS — M25661 Stiffness of right knee, not elsewhere classified: Secondary | ICD-10-CM | POA: Diagnosis not present

## 2018-07-12 DIAGNOSIS — M25561 Pain in right knee: Secondary | ICD-10-CM | POA: Diagnosis not present

## 2018-07-12 DIAGNOSIS — Z96651 Presence of right artificial knee joint: Secondary | ICD-10-CM | POA: Diagnosis not present

## 2018-07-13 DIAGNOSIS — M25661 Stiffness of right knee, not elsewhere classified: Secondary | ICD-10-CM | POA: Diagnosis not present

## 2018-07-13 DIAGNOSIS — Z96651 Presence of right artificial knee joint: Secondary | ICD-10-CM | POA: Diagnosis not present

## 2018-07-13 DIAGNOSIS — M25561 Pain in right knee: Secondary | ICD-10-CM | POA: Diagnosis not present

## 2018-07-18 DIAGNOSIS — M25561 Pain in right knee: Secondary | ICD-10-CM | POA: Diagnosis not present

## 2018-07-18 DIAGNOSIS — M25661 Stiffness of right knee, not elsewhere classified: Secondary | ICD-10-CM | POA: Diagnosis not present

## 2018-07-18 DIAGNOSIS — Z96651 Presence of right artificial knee joint: Secondary | ICD-10-CM | POA: Diagnosis not present

## 2018-07-20 DIAGNOSIS — M25661 Stiffness of right knee, not elsewhere classified: Secondary | ICD-10-CM | POA: Diagnosis not present

## 2018-07-20 DIAGNOSIS — M25561 Pain in right knee: Secondary | ICD-10-CM | POA: Diagnosis not present

## 2018-07-20 DIAGNOSIS — Z96651 Presence of right artificial knee joint: Secondary | ICD-10-CM | POA: Diagnosis not present

## 2018-07-27 DIAGNOSIS — Z96651 Presence of right artificial knee joint: Secondary | ICD-10-CM | POA: Diagnosis not present

## 2018-07-27 DIAGNOSIS — M25561 Pain in right knee: Secondary | ICD-10-CM | POA: Diagnosis not present

## 2018-07-27 DIAGNOSIS — M25661 Stiffness of right knee, not elsewhere classified: Secondary | ICD-10-CM | POA: Diagnosis not present

## 2018-07-29 DIAGNOSIS — M25661 Stiffness of right knee, not elsewhere classified: Secondary | ICD-10-CM | POA: Diagnosis not present

## 2018-07-29 DIAGNOSIS — M25561 Pain in right knee: Secondary | ICD-10-CM | POA: Diagnosis not present

## 2018-07-29 DIAGNOSIS — Z96651 Presence of right artificial knee joint: Secondary | ICD-10-CM | POA: Diagnosis not present

## 2018-08-02 DIAGNOSIS — Z96651 Presence of right artificial knee joint: Secondary | ICD-10-CM | POA: Diagnosis not present

## 2018-08-02 DIAGNOSIS — M25561 Pain in right knee: Secondary | ICD-10-CM | POA: Diagnosis not present

## 2018-08-02 DIAGNOSIS — M25661 Stiffness of right knee, not elsewhere classified: Secondary | ICD-10-CM | POA: Diagnosis not present

## 2018-08-03 DIAGNOSIS — Z471 Aftercare following joint replacement surgery: Secondary | ICD-10-CM | POA: Diagnosis not present

## 2018-08-03 DIAGNOSIS — Z96651 Presence of right artificial knee joint: Secondary | ICD-10-CM | POA: Diagnosis not present

## 2018-08-04 DIAGNOSIS — Z96651 Presence of right artificial knee joint: Secondary | ICD-10-CM | POA: Diagnosis not present

## 2018-08-04 DIAGNOSIS — M25561 Pain in right knee: Secondary | ICD-10-CM | POA: Diagnosis not present

## 2018-08-04 DIAGNOSIS — M25661 Stiffness of right knee, not elsewhere classified: Secondary | ICD-10-CM | POA: Diagnosis not present

## 2018-08-08 DIAGNOSIS — M25661 Stiffness of right knee, not elsewhere classified: Secondary | ICD-10-CM | POA: Diagnosis not present

## 2018-08-08 DIAGNOSIS — Z96651 Presence of right artificial knee joint: Secondary | ICD-10-CM | POA: Diagnosis not present

## 2018-08-08 DIAGNOSIS — M25561 Pain in right knee: Secondary | ICD-10-CM | POA: Diagnosis not present

## 2018-08-10 DIAGNOSIS — L2089 Other atopic dermatitis: Secondary | ICD-10-CM | POA: Diagnosis not present

## 2018-08-11 ENCOUNTER — Encounter: Payer: Self-pay | Admitting: Obstetrics and Gynecology

## 2018-08-11 ENCOUNTER — Ambulatory Visit: Payer: 59 | Admitting: Obstetrics and Gynecology

## 2018-08-11 VITALS — BP 116/78 | HR 74 | Ht 66.0 in | Wt 190.2 lb

## 2018-08-11 DIAGNOSIS — Z98891 History of uterine scar from previous surgery: Secondary | ICD-10-CM

## 2018-08-11 DIAGNOSIS — N852 Hypertrophy of uterus: Secondary | ICD-10-CM

## 2018-08-11 DIAGNOSIS — R102 Pelvic and perineal pain: Secondary | ICD-10-CM | POA: Diagnosis not present

## 2018-08-11 NOTE — Progress Notes (Signed)
Chief complaint: 1.  Pelvic pain 2.  Adenomyosis  Sarah Savage presents today for management planning conference and exam for surgical management of chronic pelvic pain.  Patient has a Copes history of pelvic pain and dyspareunia; clinical exam is notable for a bulky uterus suspicious for adenomyosis. Patient is amenorrheic.  She is not using vaginal estrogen as previously recommended.  Hot flashes are minimal.  Vaginal atrophy symptoms persist.  Past medical history: Menopause Chronic pelvic pain Adenomyosis Vaginal atrophy History of trichomonas, treated History of hypertension  Past surgical history: Right knee replacement Cesarean section x1  Review of systems: Bowel function is normal. Bladder function is normal. Dyspareunia persists Chronic lower abdominal pain persists No significant vaginal discharge No vaginal bleeding No UTI symptoms Minimal vasomotor symptoms  OBJECTIVE: BP 116/78   Pulse 74   Ht 5\' 6"  (1.676 m)   Wt 190 lb 3.2 oz (86.3 kg)   BMI 30.70 kg/m  Pleasant well-appearing female no acute distress.  Alert and oriented. Abdomen: Soft, nontender without organomegaly; Pfannenstiel incisions well-healed Pelvic exam: Anthropoid pelvis Bimanual-retroverted uterus approximately 12 weeks size; cervical motion tenderness 2/4; uterine tenderness 2/4; adnexa slightly tender bilaterally without palpable mass  ASSESSMENT: 1.  12 weeks size uterus, symptomatic 2.  Anthropoid pelvis including vaginal hysterectomy 3.  Suspected adenomyosis  PLAN: 1.  TAH/BSO discussed; multiple questions answered; patient's desires to proceed with surgery in November 2019 2.  Return for preop appointment the week before surgery  A total of 15 minutes were spent face-to-face with the patient during this encounter and over half of that time dealt with counseling and coordination of care.  Brayton Mars, MD  Note: This dictation was prepared with Dragon dictation along with  smaller phrase technology. Any transcriptional errors that result from this process are unintentional.

## 2018-08-11 NOTE — Patient Instructions (Signed)
1.  Total abdominal hysterectomy bilateral salpingo-oophorectomy is scheduled for November 2019. 2.  Return the week before surgery for preop appointment   Abdominal Hysterectomy Abdominal hysterectomy is a surgical procedure to remove the womb (uterus). The uterus is the muscular organ that houses a developing baby. This surgery may be done if:  You have cancer.  You have growths (tumors or fibroids) in the uterus.  You have Billinger-term (chronic) pain.  You are bleeding.  Your uterus has slipped down into your vagina (uterine prolapse).  You have a condition in which the tissue that lines the uterus grows outside of its normal location (endometriosis).  You have an infection in your uterus.  You are having problems with your menstrual cycle.  Depending on why you are having this procedure, you may also have other reproductive organs removed. These could include:  The part of your vagina that connects with your uterus (cervix).  The organs that make eggs (ovaries).  The tubes that connect the ovaries to the uterus (fallopian tubes).  Tell a health care provider about:  Any allergies you have.  All medicines you are taking, including vitamins, herbs, eye drops, creams, and over-the-counter medicines.  Any problems you or family members have had with anesthetic medicines.  Any blood disorders you have.  Any surgeries you have had.  Any medical conditions you have.  Whether you are pregnant or may be pregnant. What are the risks? Generally, this is a safe procedure. However, problems may occur, including:  Bleeding.  Infection.  Allergic reactions to medicines or dyes.  Damage to other structures or organs.  Nerve injury.  Decreased interest in sex or pain during sex.  Blood clots that can break free and travel to your lungs.  What happens before the procedure? Staying hydrated Follow instructions from your health care provider about hydration, which may  include:  Up to 2 hours before the procedure - you may continue to drink clear liquids, such as water, clear fruit juice, black coffee, and plain tea  Eating and drinking restrictions Follow instructions from your health care provider about eating and drinking, which may include:  8 hours before the procedure - stop eating heavy meals or foods such as meat, fried foods, or fatty foods.  6 hours before the procedure - stop eating light meals or foods, such as toast or cereal.  6 hours before the procedure - stop drinking milk or drinks that contain milk.  2 hours before the procedure - stop drinking clear liquids.  Medicines  Ask your health care provider about: ? Changing or stopping your regular medicines. This is especially important if you are taking diabetes medicines or blood thinners. ? Taking medicines such as aspirin and ibuprofen. These medicines can thin your blood. Do not take these medicines before your procedure if your health care provider instructs you not to.  You may be given antibiotic medicine to help prevent infection. Take it as told by your health care provider.  You may be asked to take laxatives to prevent constipation. General instructions  Ask your health care provider how your surgical site will be marked or identified.  You may be asked to shower with a germ-killing soap.  Plan to have someone take you home from the hospital.  Do not use any products that contain nicotine or tobacco, such as cigarettes and e-cigarettes. If you need help quitting, ask your health care provider.  You may have an exam or testing.  You may have  a blood or urine sample taken.  You may need to have an enema to clean out your rectum and lower colon.  This procedure can affect the way you feel about yourself. Talk to your health care provider about the physical and emotional changes this procedure may cause. What happens during the procedure?  To lower your risk of  infection: ? Your health care team will wash or sanitize their hands. ? Your skin will be washed with soap. ? Hair may be removed from the surgical area.  An IV tube will be inserted into one of your veins.  You will be given one or more of the following: ? A medicine to help you relax (sedative). ? A medicine to make you fall asleep (general anesthetic).  Tight-fitting (compression) stockings will be placed on your legs to promote circulation.  A thin, flexible tube (catheter) will be inserted to help drain your urine.  The surgeon will make a cut (incision) through the skin in your lower belly. The incision may go side-to-side or up-and-down.  The surgeon will move aside the body tissue that covers your uterus. The surgeon will then carefully take out your uterus along with any of the other organs that need to be removed.  Bleeding will be controlled with clamps or sutures.  The surgeon will close your incision with stitches (sutures), skin glue, or adhesive strips.  A bandage (dressing) will be placed over the incision. The procedure may vary among health care providers and hospitals. What happens after the procedure?  You will be given pain medicine as needed.  Your blood pressure, heart rate, breathing rate, and blood oxygen level will be monitored until the medicines you were given have worn off.  You will need to stay in the hospital to recover for one to two days. Ask your health care provider how Sh[place] you will need to stay in the hospital after your procedure.  You may have a liquid diet at first. You will most likely return to your usual diet the day after surgery.  You will still have the urinary catheter in place. It will likely be removed the day after surgery.  You may have to wear compression stockings. These stockings help to prevent blood clots and reduce swelling in your legs.  You will be encouraged to walk as soon as possible. You will also use a device or  do breathing exercises to keep your lungs clear.  You may need to use a sanitary napkin for vaginal discharge. Summary  Abdominal hysterectomy is a surgical procedure to remove the womb (uterus). The uterus is the muscular organ that houses a developing baby.  This procedure can affect the way you feel about yourself. Talk to your health care provider about the physical and emotional changes this procedure may cause.  You will be given medicines for pain after the procedure.  You will need to stay in the hospital to recover. Ask your health care provider how [place] you will need to stay in the hospital after your procedure. This information is not intended to replace advice given to you by your health care provider. Make sure you discuss any questions you have with your health care provider. Document Released: 10/17/2013 Document Revised: 09/30/2016 Document Reviewed: 09/30/2016 Elsevier Interactive Patient Education  2017 [employer].

## 2018-08-17 DIAGNOSIS — Z96651 Presence of right artificial knee joint: Secondary | ICD-10-CM | POA: Diagnosis not present

## 2018-08-17 DIAGNOSIS — M25661 Stiffness of right knee, not elsewhere classified: Secondary | ICD-10-CM | POA: Diagnosis not present

## 2018-08-17 DIAGNOSIS — M25561 Pain in right knee: Secondary | ICD-10-CM | POA: Diagnosis not present

## 2018-08-22 DIAGNOSIS — Z96651 Presence of right artificial knee joint: Secondary | ICD-10-CM | POA: Diagnosis not present

## 2018-08-22 DIAGNOSIS — M25561 Pain in right knee: Secondary | ICD-10-CM | POA: Diagnosis not present

## 2018-08-22 DIAGNOSIS — M25661 Stiffness of right knee, not elsewhere classified: Secondary | ICD-10-CM | POA: Diagnosis not present

## 2018-08-24 DIAGNOSIS — M25561 Pain in right knee: Secondary | ICD-10-CM | POA: Diagnosis not present

## 2018-08-24 DIAGNOSIS — Z96651 Presence of right artificial knee joint: Secondary | ICD-10-CM | POA: Diagnosis not present

## 2018-08-24 DIAGNOSIS — M25661 Stiffness of right knee, not elsewhere classified: Secondary | ICD-10-CM | POA: Diagnosis not present

## 2018-08-27 DIAGNOSIS — M1712 Unilateral primary osteoarthritis, left knee: Secondary | ICD-10-CM | POA: Diagnosis not present

## 2018-08-27 DIAGNOSIS — M25562 Pain in left knee: Secondary | ICD-10-CM | POA: Diagnosis not present

## 2018-08-29 DIAGNOSIS — M25661 Stiffness of right knee, not elsewhere classified: Secondary | ICD-10-CM | POA: Diagnosis not present

## 2018-08-29 DIAGNOSIS — Z96651 Presence of right artificial knee joint: Secondary | ICD-10-CM | POA: Diagnosis not present

## 2018-08-29 DIAGNOSIS — M25561 Pain in right knee: Secondary | ICD-10-CM | POA: Diagnosis not present

## 2018-08-31 DIAGNOSIS — M25561 Pain in right knee: Secondary | ICD-10-CM | POA: Diagnosis not present

## 2018-08-31 DIAGNOSIS — M25661 Stiffness of right knee, not elsewhere classified: Secondary | ICD-10-CM | POA: Diagnosis not present

## 2018-08-31 DIAGNOSIS — Z96651 Presence of right artificial knee joint: Secondary | ICD-10-CM | POA: Diagnosis not present

## 2018-09-02 DIAGNOSIS — Z79899 Other long term (current) drug therapy: Secondary | ICD-10-CM | POA: Diagnosis not present

## 2018-09-02 DIAGNOSIS — R21 Rash and other nonspecific skin eruption: Secondary | ICD-10-CM | POA: Diagnosis not present

## 2018-09-02 DIAGNOSIS — I10 Essential (primary) hypertension: Secondary | ICD-10-CM | POA: Diagnosis not present

## 2018-09-02 DIAGNOSIS — E7800 Pure hypercholesterolemia, unspecified: Secondary | ICD-10-CM | POA: Diagnosis not present

## 2018-09-02 DIAGNOSIS — R739 Hyperglycemia, unspecified: Secondary | ICD-10-CM | POA: Diagnosis not present

## 2018-09-06 ENCOUNTER — Encounter
Admission: RE | Admit: 2018-09-06 | Discharge: 2018-09-06 | Disposition: A | Discharge status: Home / Self Care | Payer: 59 | Source: Ambulatory Visit | Attending: Obstetrics and Gynecology | Admitting: Obstetrics and Gynecology

## 2018-09-06 ENCOUNTER — Other Ambulatory Visit: Payer: Self-pay

## 2018-09-06 ENCOUNTER — Encounter: Payer: Self-pay | Admitting: Obstetrics and Gynecology

## 2018-09-06 ENCOUNTER — Ambulatory Visit (INDEPENDENT_AMBULATORY_CARE_PROVIDER_SITE_OTHER): Payer: 59 | Admitting: Obstetrics and Gynecology

## 2018-09-06 VITALS — BP 133/77 | Ht 66.0 in | Wt 192.9 lb

## 2018-09-06 DIAGNOSIS — Z01812 Encounter for preprocedural laboratory examination: Secondary | ICD-10-CM | POA: Diagnosis not present

## 2018-09-06 DIAGNOSIS — Z01818 Encounter for other preprocedural examination: Secondary | ICD-10-CM

## 2018-09-06 DIAGNOSIS — Z98891 History of uterine scar from previous surgery: Secondary | ICD-10-CM

## 2018-09-06 DIAGNOSIS — N852 Hypertrophy of uterus: Secondary | ICD-10-CM

## 2018-09-06 LAB — TYPE AND SCREEN
ABO/RH(D): A POS
ANTIBODY SCREEN: NEGATIVE

## 2018-09-06 LAB — CBC WITH DIFFERENTIAL/PLATELET
ABS IMMATURE GRANULOCYTES: 0.04 10*3/uL (ref 0.00–0.07)
BASOS ABS: 0 10*3/uL (ref 0.0–0.1)
Basophils Relative: 0 %
EOS PCT: 1 %
Eosinophils Absolute: 0.1 10*3/uL (ref 0.0–0.5)
HEMATOCRIT: 40.8 % (ref 36.0–46.0)
HEMOGLOBIN: 13 g/dL (ref 12.0–15.0)
Immature Granulocytes: 1 %
LYMPHS PCT: 15 %
Lymphs Abs: 1.1 10*3/uL (ref 0.7–4.0)
MCH: 26.6 pg (ref 26.0–34.0)
MCHC: 31.9 g/dL (ref 30.0–36.0)
MCV: 83.6 fL (ref 80.0–100.0)
MONO ABS: 0.2 10*3/uL (ref 0.1–1.0)
Monocytes Relative: 3 %
NEUTROS ABS: 6.1 10*3/uL (ref 1.7–7.7)
Neutrophils Relative %: 80 %
Platelets: 408 10*3/uL — ABNORMAL HIGH (ref 150–400)
RBC: 4.88 MIL/uL (ref 3.87–5.11)
RDW: 13.8 % (ref 11.5–15.5)
WBC: 7.5 10*3/uL (ref 4.0–10.5)
nRBC: 0 % (ref 0.0–0.2)

## 2018-09-06 LAB — BASIC METABOLIC PANEL
ANION GAP: 10 (ref 5–15)
BUN: 16 mg/dL (ref 6–20)
CALCIUM: 9.2 mg/dL (ref 8.9–10.3)
CO2: 26 mmol/L (ref 22–32)
Chloride: 105 mmol/L (ref 98–111)
Creatinine, Ser: 0.63 mg/dL (ref 0.44–1.00)
GFR calc Af Amer: >60 mL/min (ref >60)
GLUCOSE: 197 mg/dL — AB (ref 70–99)
POTASSIUM: 3.7 mmol/L (ref 3.5–5.1)
SODIUM: 141 mmol/L (ref 135–145)

## 2018-09-06 NOTE — Patient Instructions (Signed)
1.  Return first week of December for postop check.

## 2018-09-06 NOTE — H&P (View-Only) (Signed)
PREOPERATIVE HISTORY AND PHYSICAL  Date of surgery: 09/12/2018 Diagnosis: Symptomatic fibroid uterus Procedure: TAH BSO    Patient is a 57 y.o. G4P1020female scheduled for LAVH BSO on 09/12/2018. [place] history of chronic pelvic pain and dyspareunia; clinical exam suspicious for a bulky uterus with probable adenomyosis.  She is status post endometrial ablation.  Pertinent Gynecological History: Menopausal Chronic pelvic pain Probable adenomyosis History of cesarean section x1   OB History    Gravida  4   Para  1   Term  1   Preterm      AB  3   Living  1     SAB      TAB  3   Ectopic      Multiple      Live Births  1           No LMP recorded. Patient has had an ablation.    Past Medical History:  Diagnosis Date  . Hyperglycemia   . Hyperlipidemia   . Hypertension   . Plantar fasciitis     Past Surgical History:  Procedure Laterality Date  . ENDOMETRIAL ABLATION    . PARTIAL KNEE ARTHROPLASTY Right 06/21/2018   Procedure: RIGHT UNICOMPARTMENTAL KNEE-MEDIALLY;  Surgeon: Paralee Cancel, MD;  Location: [facility];  Service: Orthopedics;  Laterality: Right;  70 mins  . STOMACH SURGERY     excision of ulcer  . toenail removal   1990s    OB History  Gravida Para Term Preterm AB Living  4 1 1   3 1   SAB TAB Ectopic Multiple Live Births    3     1    # Outcome Date GA Lbr Len/2nd Weight Sex Delivery Anes PTL Lv  4 Term 1991   9 lb 12.8 oz (4.445 kg) M CS-LTranv   LIV  3 TAB 1990          2 TAB 1985          1 TAB             Social History   Socioeconomic History  . Marital status: Married    Spouse name: Not on file  . Number of children: Not on file  . Years of education: Not on file  . Highest education level: Not on file  Occupational History  . Not on file  Social Needs  . Financial resource strain: Not on file  . Food insecurity:    Worry: Not on file    Inability: Not on file  . Transportation needs:    Medical: Not on file   Non-medical: Not on file  Tobacco Use  . Smoking status: Never Smoker  . Smokeless tobacco: Never Used  Substance and Sexual Activity  . Alcohol use: Yes    Alcohol/week: 0.0 standard drinks    Comment: occas  . Drug use: No  . Sexual activity: Not Currently  Lifestyle  . Physical activity:    Days per week: 0 days    Minutes per session: 0 min  . Stress: Not on file  Relationships  . Social connections:    Talks on phone: Not on file    Gets together: Not on file    Attends religious service: Not on file    Active member of club or organization: Not on file    Attends meetings of clubs or organizations: Not on file    Relationship status: Not on file  Other Topics Concern  . Not on file  Social History Narrative  . Not on file    Family History  Problem Relation Age of Onset  . Hypertension Mother   . Dementia Mother   . Hypertension Father   . Arthritis Father   . Stroke Brother   . Hypertension Brother   . Diabetes Brother   . Cancer Maternal Grandmother        Breast  . Breast cancer Maternal Grandmother 60     (Not in a hospital admission)  Allergies  Allergen Reactions  . Penicillins Other (See Comments)    "I gives me a yeast infection" Has patient had a PCN reaction causing immediate rash, facial/tongue/throat swelling, SOB or lightheadedness with hypotension: No Has patient had a PCN reaction causing severe rash involving mucus membranes or skin necrosis: No Has patient had a PCN reaction that required hospitalization: No Has patient had a PCN reaction occurring within the last 10 years: No If all of the above answers are "NO", then may proceed with Cephalosporin use.     Review of Systems Constitutional: No recent fever/chills/sweats Respiratory: No recent cough/bronchitis Cardiovascular: No chest pain Gastrointestinal: No recent nausea/vomiting/diarrhea Genitourinary: No UTI symptoms Hematologic/lymphatic:No history of coagulopathy or recent  blood thinner use    Objective:    BP 133/77   Ht 5\' 6"  (1.676 m)   Wt 192 lb 14.4 oz (87.5 kg)   BMI 31.13 kg/m   General:   Normal  Skin:   normal  HEENT:  Normal  Neck:  Supple without Adenopathy or Thyromegaly  Lungs:   Heart:              Breasts:   Abdomen:  Pelvis:  M/S   Extremeties:  Neuro:    clear to auscultation bilaterally   Normal without murmur   Not Examined   soft, non-tender; bowel sounds normal; no masses,  no organomegaly   Exam deferred to OR  No CVAT  Warm/Dry   Normal        08/11/2018 Pelvic exam: Anthropoid pelvis Bimanual-retroverted uterus approximately 12 weeks size; cervical motion tenderness 2/4; uterine tenderness 2/4; adnexa slightly tender bilaterally without palpable mass   Assessment:    Symptomatic fibroid uterus Possible adenomyosis Status post endometrial ablation Anthropoid pelvis-suboptimal vaginal surgery candidate   Plan:  TAH/BSO  Preop counseling: Patient is to undergo TAH/BSO on 09/12/2018 for management of symptomatic fibroid uterus/probable adenomyosis.  Patient has anthropoid pelvis precluding vaginal approach.  Patient is aware of and is in understanding of all pelvic surgery risks which include but are not limited to bleeding, infection, pelvic organ injury with need for repair, blood clot disorders, anesthesia risk, etc.  All questions have been answered.  Informed consent is given.  Patient is ready willing to proceed with surgery as scheduled. Pfannenstiel incision will be performed. All questions have been addressed.  Brayton Mars, MD  Note: This dictation was prepared with Dragon dictation along with smaller phrase technology. Any transcriptional errors that result from this process are unintentional.

## 2018-09-06 NOTE — Patient Instructions (Signed)
Your procedure is scheduled on: Monday, September 12, 2018 Report to Day Surgery on the 2nd floor of the [facility]. To find out your arrival time, please call [phone] between 1PM - 3PM on: Friday, September 09, 2018  REMEMBER: Instructions that are not followed completely may result in serious medical risk, up to and including death; or upon the discretion of your surgeon and anesthesiologist your surgery may need to be rescheduled.  Do not eat food after midnight the night before surgery.  No gum chewing, lozengers or hard candies.  You may however, drink CLEAR liquids up to 2 hours before you are scheduled to arrive for your surgery. Do not drink anything within 2 hours of the start of your surgery.  Clear liquids include: - water  - apple juice without pulp - gatorade - black coffee or tea (Do NOT add milk or creamers to the coffee or tea) Do NOT drink anything that is not on this list.  No Alcohol for 24 hours before or after surgery.  No Smoking including e-cigarettes for 24 hours prior to surgery.  No chewable tobacco products for at least 6 hours prior to surgery.  No nicotine patches on the day of surgery.  On the morning of surgery brush your teeth with toothpaste and water, you may rinse your mouth with mouthwash if you wish. Do not swallow any toothpaste or mouthwash.  Notify your doctor if there is any change in your medical condition (cold, fever, infection).  Do not wear jewelry, make-up, hairpins, clips or nail polish.  Do not wear lotions, powders, or perfumes.   Do not shave 48 hours prior to surgery.   Contacts and dentures may not be worn into surgery.  Do not bring valuables to the hospital, including drivers license, insurance or credit cards.  [facility] is not responsible for any belongings or valuables.   TAKE THESE MEDICATIONS THE MORNING OF SURGERY:  none  Use CHG Soap as directed on instruction sheet.  NOW!  Stop Anti-inflammatories  (NSAIDS) such as DICLOFENAC,  Advil, Aleve, Ibuprofen, Motrin, Naproxen, Naprosyn and Aspirin based products such as Excedrin, Goodys Powder, BC Powder. (May take Tylenol or Acetaminophen if needed.)  NOW!  Stop ANY OVER THE COUNTER supplements until after surgery.  Wear comfortable clothing (specific to your surgery type) to the hospital.  Plan for stool softeners for home use.  If you are being admitted to the hospital overnight, leave your suitcase in the car. After surgery it may be brought to your room.   Please call [phone] if you have any questions about these instructions.

## 2018-09-06 NOTE — Progress Notes (Addendum)
PREOPERATIVE HISTORY AND PHYSICAL  Date of surgery: 09/12/2018 Diagnosis: Symptomatic fibroid uterus Procedure: TAH BSO    Patient is a 57 y.o. G4P108female scheduled for TAH BSO on 09/12/2018. Brouhard history of And dyspareunia; clinical exam suspicious for a bulky uterus with probable adenomyosis.  She is status post endometrial ablation.  Pertinent Gynecological History: Menopausal Chronic pelvic pain Probable adenomyosis History of cesarean section x1   OB History    Gravida  4   Para  1   Term  1   Preterm      AB  3   Living  1     SAB      TAB  3   Ectopic      Multiple      Live Births  1           No LMP recorded. Patient has had an ablation.    Past Medical History:  Diagnosis Date  . Hyperglycemia   . Hyperlipidemia   . Hypertension   . Plantar fasciitis     Past Surgical History:  Procedure Laterality Date  . ENDOMETRIAL ABLATION    . PARTIAL KNEE ARTHROPLASTY Right 06/21/2018   Procedure: RIGHT UNICOMPARTMENTAL KNEE-MEDIALLY;  Surgeon: Paralee Cancel, MD;  Location: [facility];  Service: Orthopedics;  Laterality: Right;  70 mins  . STOMACH SURGERY     excision of ulcer  . toenail removal   1990s    OB History  Gravida Para Term Preterm AB Living  4 1 1   3 1   SAB TAB Ectopic Multiple Live Births    3     1    # Outcome Date GA Lbr Len/2nd Weight Sex Delivery Anes PTL Lv  4 Term 1991   9 lb 12.8 oz (4.445 kg) M CS-LTranv   LIV  3 TAB 1990          2 TAB 1985          1 TAB             Social History   Socioeconomic History  . Marital status: Married    Spouse name: Not on file  . Number of children: Not on file  . Years of education: Not on file  . Highest education level: Not on file  Occupational History  . Not on file  Social Needs  . Financial resource strain: Not on file  . Food insecurity:    Worry: Not on file    Inability: Not on file  . Transportation needs:    Medical: Not on file    Non-medical: Not on file   Tobacco Use  . Smoking status: Never Smoker  . Smokeless tobacco: Never Used  Substance and Sexual Activity  . Alcohol use: Yes    Alcohol/week: 0.0 standard drinks    Comment: occas  . Drug use: No  . Sexual activity: Not Currently  Lifestyle  . Physical activity:    Days per week: 0 days    Minutes per session: 0 min  . Stress: Not on file  Relationships  . Social connections:    Talks on phone: Not on file    Gets together: Not on file    Attends religious service: Not on file    Active member of club or organization: Not on file    Attends meetings of clubs or organizations: Not on file    Relationship status: Not on file  Other Topics Concern  . Not on file  Social History Narrative  . Not on file    Family History  Problem Relation Age of Onset  . Hypertension Mother   . Dementia Mother   . Hypertension Father   . Arthritis Father   . Stroke Brother   . Hypertension Brother   . Diabetes Brother   . Cancer Maternal Grandmother        Breast  . Breast cancer Maternal Grandmother 60     (Not in a hospital admission)  Allergies  Allergen Reactions  . Penicillins Other (See Comments)    "I gives me a yeast infection" Has patient had a PCN reaction causing immediate rash, facial/tongue/throat swelling, SOB or lightheadedness with hypotension: No Has patient had a PCN reaction causing severe rash involving mucus membranes or skin necrosis: No Has patient had a PCN reaction that required hospitalization: No Has patient had a PCN reaction occurring within the last 10 years: No If all of the above answers are "NO", then may proceed with Cephalosporin use.     Review of Systems Constitutional: No recent fever/chills/sweats Respiratory: No recent cough/bronchitis Cardiovascular: No chest pain Gastrointestinal: No recent nausea/vomiting/diarrhea Genitourinary: No UTI symptoms Hematologic/lymphatic:No history of coagulopathy or recent blood thinner use     Objective:    BP 133/77   Ht 5\' 6"  (1.676 m)   Wt 192 lb 14.4 oz (87.5 kg)   BMI 31.13 kg/m   General:   Normal  Skin:   normal  HEENT:  Normal  Neck:  Supple without Adenopathy or Thyromegaly  Lungs:   Heart:              Breasts:   Abdomen:  Pelvis:  M/S   Extremeties:  Neuro:    clear to auscultation bilaterally   Normal without murmur   Not Examined   soft, non-tender; bowel sounds normal; no masses,  no organomegaly   Exam deferred to OR  No CVAT  Warm/Dry   Normal        08/11/2018 Pelvic exam: Anthropoid pelvis Bimanual-retroverted uterus approximately 12 weeks size; cervical motion tenderness 2/4; uterine tenderness 2/4; adnexa slightly tender bilaterally without palpable mass   Assessment:     Symptomatic fibroid uterus Possible adenomyosis Status post endometrial ablation Anthropoid pelvis-suboptimal vaginal surgery candidate   Plan:  TAH/BSO  Preop counseling: Patient is to undergo TAH/BSO on 09/12/2018 for management of symptomatic fibroid uterus/probable adenomyosis.  Patient has anthropoid pelvis precluding vaginal approach.  Patient is aware of and is in understanding of all pelvic surgery risks which include but are not limited to bleeding, infection, pelvic organ injury with need for repair, blood clot disorders, anesthesia risk, etc.  All questions have been answered.  Informed consent is given.  Patient is ready willing to proceed with surgery as scheduled. Pfannenstiel incision will be performed. All questions have been addressed.  Brayton Mars, MD  Note: This dictation was prepared with Dragon dictation along with smaller phrase technology. Any transcriptional errors that result from this process are unintentional.

## 2018-09-06 NOTE — H&P (Signed)
PREOPERATIVE HISTORY AND PHYSICAL  Date of surgery: 09/12/2018 Diagnosis: Symptomatic fibroid uterus Procedure: TAH BSO    Patient is a 57 y.o. [id number] scheduled for LAVH BSO on 09/12/2018. Sarah Savage history of chronic pelvic pain and dyspareunia; clinical exam suspicious for a bulky uterus with probable adenomyosis.  She is status post endometrial ablation.  Pertinent Gynecological History: Menopausal Chronic pelvic pain Probable adenomyosis History of cesarean section x1   OB History    Gravida  4   Para  1   Term  1   Preterm      AB  3   Living  1     SAB      TAB  3   Ectopic      Multiple      Live Births  1           No LMP recorded. Patient has had an ablation.    Past Medical History:  Diagnosis Date  . Hyperglycemia   . Hyperlipidemia   . Hypertension   . Plantar fasciitis     Past Surgical History:  Procedure Laterality Date  . ENDOMETRIAL ABLATION    . PARTIAL KNEE ARTHROPLASTY Right 06/21/2018   Procedure: RIGHT UNICOMPARTMENTAL KNEE-MEDIALLY;  Surgeon: Paralee Cancel, MD;  Location: [facility];  Service: Orthopedics;  Laterality: Right;  70 mins  . STOMACH SURGERY     excision of ulcer  . toenail removal   1990s    OB History  Gravida Para Term Preterm AB Living  4 1 1   3 1   SAB TAB Ectopic Multiple Live Births    3     1    # Outcome Date GA Lbr Len/2nd Weight Sex Delivery Anes PTL Lv  4 Term 1991   9 lb 12.8 oz (4.445 kg) M CS-LTranv   LIV  3 TAB 1990          2 TAB 1985          1 TAB             Social History   Socioeconomic History  . Marital status: Married    Spouse name: Not on file  . Number of children: Not on file  . Years of education: Not on file  . Highest education level: Not on file  Occupational History  . Not on file  Social Needs  . Financial resource strain: Not on file  . Food insecurity:    Worry: Not on file    Inability: Not on file  . Transportation needs:    Medical: Not on file   Non-medical: Not on file  Tobacco Use  . Smoking status: Never Smoker  . Smokeless tobacco: Never Used  Substance and Sexual Activity  . Alcohol use: Yes    Alcohol/week: 0.0 standard drinks    Comment: occas  . Drug use: No  . Sexual activity: Not Currently  Lifestyle  . Physical activity:    Days per week: 0 days    Minutes per session: 0 min  . Stress: Not on file  Relationships  . Social connections:    Talks on phone: Not on file    Gets together: Not on file    Attends religious service: Not on file    Active member of club or organization: Not on file    Attends meetings of clubs or organizations: Not on file    Relationship status: Not on file  Other Topics Concern  . Not on file  Social History Narrative  . Not on file    Family History  Problem Relation Age of Onset  . Hypertension Mother   . Dementia Mother   . Hypertension Father   . Arthritis Father   . Stroke Brother   . Hypertension Brother   . Diabetes Brother   . Cancer Maternal Grandmother        Breast  . Breast cancer Maternal Grandmother 60     (Not in a hospital admission)  Allergies  Allergen Reactions  . Penicillins Other (See Comments)    "I gives me a yeast infection" Has patient had a PCN reaction causing immediate rash, facial/tongue/throat swelling, SOB or lightheadedness with hypotension: No Has patient had a PCN reaction causing severe rash involving mucus membranes or skin necrosis: No Has patient had a PCN reaction that required hospitalization: No Has patient had a PCN reaction occurring within the last 10 years: No If all of the above answers are "NO", then may proceed with Cephalosporin use.     Review of Systems Constitutional: No recent fever/chills/sweats Respiratory: No recent cough/bronchitis Cardiovascular: No chest pain Gastrointestinal: No recent nausea/vomiting/diarrhea Genitourinary: No UTI symptoms Hematologic/lymphatic:No history of coagulopathy or recent  blood thinner use    Objective:    BP 133/77   Ht 5\' 6"  (1.676 m)   Wt 192 lb 14.4 oz (87.5 kg)   BMI 31.13 kg/m   General:   Normal  Skin:   normal  HEENT:  Normal  Neck:  Supple without Adenopathy or Thyromegaly  Lungs:   Heart:              Breasts:   Abdomen:  Pelvis:  M/S   Extremeties:  Neuro:    clear to auscultation bilaterally   Normal without murmur   Not Examined   soft, non-tender; bowel sounds normal; no masses,  no organomegaly   Exam deferred to OR  No CVAT  Warm/Dry   Normal        08/11/2018 Pelvic exam: Anthropoid pelvis Bimanual-retroverted uterus approximately 12 weeks size; cervical motion tenderness 2/4; uterine tenderness 2/4; adnexa slightly tender bilaterally without palpable mass   Assessment:    Symptomatic fibroid uterus Possible adenomyosis Status post endometrial ablation Anthropoid pelvis-suboptimal vaginal surgery candidate   Plan:  TAH/BSO  Preop counseling: Patient is to undergo TAH/BSO on 09/12/2018 for management of symptomatic fibroid uterus/probable adenomyosis.  Patient has anthropoid pelvis precluding vaginal approach.  Patient is aware of and is in understanding of all pelvic surgery risks which include but are not limited to bleeding, infection, pelvic organ injury with need for repair, blood clot disorders, anesthesia risk, etc.  All questions have been answered.  Informed consent is given.  Patient is ready willing to proceed with surgery as scheduled. Pfannenstiel incision will be performed. All questions have been addressed.  Brayton Mars, MD  Note: This dictation was prepared with Dragon dictation along with smaller phrase technology. Any transcriptional errors that result from this process are unintentional.

## 2018-09-07 LAB — RPR: RPR Ser Ql: NONREACTIVE

## 2018-09-11 MED ORDER — CEFAZOLIN SODIUM-DEXTROSE 2-4 GM/100ML-% IV SOLN
2.0000 g | INTRAVENOUS | Status: AC
  Administered 2018-09-12: 2 g via INTRAVENOUS

## 2018-09-12 ENCOUNTER — Encounter
Admission: RE | Disposition: A | Discharge status: Home / Self Care | Payer: Self-pay | Source: Home / Self Care | Attending: Obstetrics and Gynecology

## 2018-09-12 ENCOUNTER — Inpatient Hospital Stay: Payer: 59 | Admitting: Certified Registered"

## 2018-09-12 ENCOUNTER — Inpatient Hospital Stay
Admission: RE | Admit: 2018-09-12 | Discharge: 2018-09-14 | DRG: 743 | Disposition: A | Discharge status: Home / Self Care | Payer: 59 | Attending: Obstetrics and Gynecology | Admitting: Obstetrics and Gynecology

## 2018-09-12 ENCOUNTER — Other Ambulatory Visit: Payer: Self-pay

## 2018-09-12 DIAGNOSIS — G8929 Other chronic pain: Secondary | ICD-10-CM | POA: Diagnosis present

## 2018-09-12 DIAGNOSIS — N838 Other noninflammatory disorders of ovary, fallopian tube and broad ligament: Secondary | ICD-10-CM

## 2018-09-12 DIAGNOSIS — D251 Intramural leiomyoma of uterus: Secondary | ICD-10-CM

## 2018-09-12 DIAGNOSIS — D259 Leiomyoma of uterus, unspecified: Secondary | ICD-10-CM | POA: Diagnosis not present

## 2018-09-12 DIAGNOSIS — N800 Endometriosis of uterus: Secondary | ICD-10-CM | POA: Diagnosis present

## 2018-09-12 DIAGNOSIS — R102 Pelvic and perineal pain: Secondary | ICD-10-CM | POA: Diagnosis present

## 2018-09-12 DIAGNOSIS — Z880 Allergy status to penicillin: Secondary | ICD-10-CM | POA: Diagnosis not present

## 2018-09-12 DIAGNOSIS — Z9079 Acquired absence of other genital organ(s): Secondary | ICD-10-CM

## 2018-09-12 DIAGNOSIS — Z90710 Acquired absence of both cervix and uterus: Secondary | ICD-10-CM

## 2018-09-12 DIAGNOSIS — N941 Unspecified dyspareunia: Secondary | ICD-10-CM | POA: Diagnosis present

## 2018-09-12 DIAGNOSIS — R109 Unspecified abdominal pain: Secondary | ICD-10-CM | POA: Diagnosis not present

## 2018-09-12 DIAGNOSIS — Z90722 Acquired absence of ovaries, bilateral: Secondary | ICD-10-CM

## 2018-09-12 HISTORY — PX: HYSTERECTOMY ABDOMINAL WITH SALPINGO-OOPHORECTOMY: SHX6792

## 2018-09-12 LAB — RAPID HIV SCREEN (HIV 1/2 AB+AG)
HIV 1/2 Antibodies: NONREACTIVE
HIV-1 P24 ANTIGEN - HIV24: NONREACTIVE

## 2018-09-12 SURGERY — HYSTERECTOMY, ABDOMINAL, WITH SALPINGO-OOPHORECTOMY
Anesthesia: General | Laterality: Bilateral

## 2018-09-12 MED ORDER — SUGAMMADEX SODIUM 200 MG/2ML IV SOLN
INTRAVENOUS | Status: DC | PRN
  Administered 2018-09-12: 200 mg via INTRAVENOUS

## 2018-09-12 MED ORDER — ROCURONIUM BROMIDE 100 MG/10ML IV SOLN
INTRAVENOUS | Status: DC | PRN
  Administered 2018-09-12: 30 mg via INTRAVENOUS
  Administered 2018-09-12 (×2): 10 mg via INTRAVENOUS

## 2018-09-12 MED ORDER — MORPHINE SULFATE (PF) 2 MG/ML IV SOLN: 1.0000 mg | INTRAVENOUS | Status: DC | PRN

## 2018-09-12 MED ORDER — ACETAMINOPHEN 10 MG/ML IV SOLN
INTRAVENOUS | Status: DC | PRN
  Administered 2018-09-12: 1000 mg via INTRAVENOUS

## 2018-09-12 MED ORDER — MIDAZOLAM HCL 2 MG/2ML IJ SOLN
INTRAMUSCULAR | Status: DC | PRN
  Administered 2018-09-12: 2 mg via INTRAVENOUS

## 2018-09-12 MED ORDER — PROPOFOL 500 MG/50ML IV EMUL
INTRAVENOUS | Status: DC | PRN
  Administered 2018-09-12: 20 ug/kg/min via INTRAVENOUS

## 2018-09-12 MED ORDER — SUCCINYLCHOLINE CHLORIDE 20 MG/ML IJ SOLN
INTRAMUSCULAR | Status: AC
  Filled 2018-09-12: qty 1

## 2018-09-12 MED ORDER — PHENYLEPHRINE HCL 10 MG/ML IJ SOLN
INTRAMUSCULAR | Status: DC | PRN
  Administered 2018-09-12 (×5): 100 ug via INTRAVENOUS

## 2018-09-12 MED ORDER — FENTANYL CITRATE (PF) 250 MCG/5ML IJ SOLN
INTRAMUSCULAR | Status: AC
  Filled 2018-09-12: qty 5

## 2018-09-12 MED ORDER — LIDOCAINE HCL (PF) 2 % IJ SOLN
INTRAMUSCULAR | Status: AC
  Filled 2018-09-12: qty 10

## 2018-09-12 MED ORDER — SUCCINYLCHOLINE CHLORIDE 20 MG/ML IJ SOLN
INTRAMUSCULAR | Status: DC | PRN
  Administered 2018-09-12: 120 mg via INTRAVENOUS

## 2018-09-12 MED ORDER — SUGAMMADEX SODIUM 200 MG/2ML IV SOLN
INTRAVENOUS | Status: AC
  Filled 2018-09-12: qty 2

## 2018-09-12 MED ORDER — PROPOFOL 10 MG/ML IV BOLUS
INTRAVENOUS | Status: AC
  Filled 2018-09-12: qty 20

## 2018-09-12 MED ORDER — MIDAZOLAM HCL 2 MG/2ML IJ SOLN
INTRAMUSCULAR | Status: AC
  Filled 2018-09-12: qty 2

## 2018-09-12 MED ORDER — FENTANYL CITRATE (PF) 100 MCG/2ML IJ SOLN
INTRAMUSCULAR | Status: DC | PRN
  Administered 2018-09-12 (×2): 50 ug via INTRAVENOUS
  Administered 2018-09-12: 100 ug via INTRAVENOUS
  Administered 2018-09-12: 50 ug via INTRAVENOUS

## 2018-09-12 MED ORDER — ACETAMINOPHEN NICU IV SYRINGE 10 MG/ML
INTRAVENOUS | Status: AC
  Filled 2018-09-12: qty 1

## 2018-09-12 MED ORDER — LIDOCAINE HCL (CARDIAC) PF 100 MG/5ML IV SOSY
PREFILLED_SYRINGE | INTRAVENOUS | Status: DC | PRN
  Administered 2018-09-12: 100 mg via INTRAVENOUS

## 2018-09-12 MED ORDER — SIMETHICONE 80 MG PO CHEW
80.0000 mg | CHEWABLE_TABLET | Freq: Four times a day (QID) | ORAL | Status: DC | PRN
  Administered 2018-09-12 – 2018-09-14 (×3): 80 mg via ORAL
  Filled 2018-09-12 (×3): qty 1

## 2018-09-12 MED ORDER — FAMOTIDINE 20 MG PO TABS
ORAL_TABLET | ORAL | Status: AC
  Administered 2018-09-12: 20 mg
  Filled 2018-09-12: qty 1

## 2018-09-12 MED ORDER — PROPOFOL 10 MG/ML IV BOLUS
INTRAVENOUS | Status: DC | PRN
  Administered 2018-09-12: 30 mg via INTRAVENOUS
  Administered 2018-09-12: 170 mg via INTRAVENOUS

## 2018-09-12 MED ORDER — LACTATED RINGERS IV SOLN
INTRAVENOUS | Status: DC
  Administered 2018-09-12 (×2): via INTRAVENOUS

## 2018-09-12 MED ORDER — ONDANSETRON HCL 4 MG/2ML IJ SOLN: 4.0000 mg | Freq: Once | INTRAMUSCULAR | Status: DC | PRN

## 2018-09-12 MED ORDER — DEXAMETHASONE SODIUM PHOSPHATE 10 MG/ML IJ SOLN
INTRAMUSCULAR | Status: AC
  Filled 2018-09-12: qty 1

## 2018-09-12 MED ORDER — FENTANYL CITRATE (PF) 100 MCG/2ML IJ SOLN
25.0000 ug | INTRAMUSCULAR | Status: DC | PRN
  Administered 2018-09-12 (×4): 25 ug via INTRAVENOUS

## 2018-09-12 MED ORDER — SEVOFLURANE IN SOLN
RESPIRATORY_TRACT | Status: AC
  Filled 2018-09-12: qty 250

## 2018-09-12 MED ORDER — KETOROLAC TROMETHAMINE 30 MG/ML IJ SOLN: 30.0000 mg | Freq: Four times a day (QID) | INTRAMUSCULAR | Status: DC

## 2018-09-12 MED ORDER — LACTATED RINGERS IV SOLN
INTRAVENOUS | Status: DC
  Administered 2018-09-12 – 2018-09-13 (×3): via INTRAVENOUS

## 2018-09-12 MED ORDER — DEXAMETHASONE SODIUM PHOSPHATE 10 MG/ML IJ SOLN
INTRAMUSCULAR | Status: DC | PRN
  Administered 2018-09-12: 10 mg via INTRAVENOUS

## 2018-09-12 MED ORDER — CEFAZOLIN SODIUM-DEXTROSE 2-4 GM/100ML-% IV SOLN
INTRAVENOUS | Status: AC
  Filled 2018-09-12: qty 100

## 2018-09-12 MED ORDER — ROCURONIUM BROMIDE 50 MG/5ML IV SOLN
INTRAVENOUS | Status: AC
  Filled 2018-09-12: qty 1

## 2018-09-12 MED ORDER — ACETAMINOPHEN 500 MG PO TABS
1000.0000 mg | ORAL_TABLET | Freq: Four times a day (QID) | ORAL | Status: DC
  Administered 2018-09-12 – 2018-09-14 (×8): 1000 mg via ORAL
  Filled 2018-09-12 (×8): qty 2

## 2018-09-12 MED ORDER — KETOROLAC TROMETHAMINE 30 MG/ML IJ SOLN
30.0000 mg | Freq: Four times a day (QID) | INTRAMUSCULAR | Status: DC
  Administered 2018-09-12 – 2018-09-13 (×3): 30 mg via INTRAVENOUS
  Filled 2018-09-12 (×3): qty 1

## 2018-09-12 MED ORDER — ONDANSETRON HCL 4 MG/2ML IJ SOLN
INTRAMUSCULAR | Status: DC | PRN
  Administered 2018-09-12: 4 mg via INTRAVENOUS

## 2018-09-12 MED ORDER — FAMOTIDINE 20 MG PO TABS
20.0000 mg | ORAL_TABLET | Freq: Once | ORAL | Status: AC
  Administered 2018-09-12: 20 mg via ORAL

## 2018-09-12 MED ORDER — ONDANSETRON HCL 4 MG/2ML IJ SOLN
INTRAMUSCULAR | Status: AC
  Filled 2018-09-12: qty 2

## 2018-09-12 MED ORDER — LIDOCAINE 5 % EX PTCH
MEDICATED_PATCH | CUTANEOUS | Status: DC | PRN
  Administered 2018-09-12: 1 via TRANSDERMAL

## 2018-09-12 MED ORDER — DOCUSATE SODIUM 100 MG PO CAPS
100.0000 mg | ORAL_CAPSULE | Freq: Two times a day (BID) | ORAL | Status: DC
  Administered 2018-09-12 – 2018-09-14 (×3): 100 mg via ORAL
  Filled 2018-09-12 (×4): qty 1

## 2018-09-12 MED ORDER — BISACODYL 10 MG RE SUPP: 10.0000 mg | Freq: Every day | RECTAL | Status: DC | PRN

## 2018-09-12 MED ORDER — FENTANYL CITRATE (PF) 100 MCG/2ML IJ SOLN
INTRAMUSCULAR | Status: AC
  Administered 2018-09-12: 25 ug via INTRAVENOUS
  Filled 2018-09-12: qty 2

## 2018-09-12 SURGICAL SUPPLY — 36 items
CANISTER SUCT 1200ML W/VALVE (MISCELLANEOUS) ×2 IMPLANT
CHLORAPREP W/TINT 26ML (MISCELLANEOUS) ×2 IMPLANT
COVER WAND RF STERILE (DRAPES) ×2 IMPLANT
DRAPE LAPAROTOMY 100X77 ABD (DRAPES) ×2 IMPLANT
DRAPE LAPAROTOMY TRNSV 106X77 (MISCELLANEOUS) IMPLANT
DRSG TELFA 3X8 NADH (GAUZE/BANDAGES/DRESSINGS) ×2 IMPLANT
ELECT BLADE 6 FLAT ULTRCLN (ELECTRODE) ×2 IMPLANT
ELECT BLADE 6.5 EXT (BLADE) ×2 IMPLANT
ELECT CAUTERY BLADE 6.4 (BLADE) ×2 IMPLANT
ELECT REM PT RETURN 9FT ADLT (ELECTROSURGICAL) ×2
ELECTRODE REM PT RTRN 9FT ADLT (ELECTROSURGICAL) ×1 IMPLANT
GAUZE SPONGE 4X4 12PLY STRL (GAUZE/BANDAGES/DRESSINGS) ×2 IMPLANT
GLOVE BIO SURGEON STRL SZ 6.5 (GLOVE) ×4 IMPLANT
GLOVE BIO SURGEON STRL SZ8 (GLOVE) ×4 IMPLANT
GLOVE INDICATOR 7.0 STRL GRN (GLOVE) ×4 IMPLANT
GLOVE INDICATOR 8.0 STRL GRN (GLOVE) ×2 IMPLANT
GOWN STRL REUS W/ TWL LRG LVL3 (GOWN DISPOSABLE) ×2 IMPLANT
GOWN STRL REUS W/ TWL XL LVL3 (GOWN DISPOSABLE) ×1 IMPLANT
GOWN STRL REUS W/TWL LRG LVL3 (GOWN DISPOSABLE) ×2
GOWN STRL REUS W/TWL XL LVL3 (GOWN DISPOSABLE) ×1
KIT TURNOVER KIT A (KITS) ×2 IMPLANT
LABEL OR SOLS (LABEL) ×2 IMPLANT
LIGASURE IMPACT 36 18CM CVD LR (INSTRUMENTS) ×2 IMPLANT
PACK BASIN MAJOR ARMC (MISCELLANEOUS) ×2 IMPLANT
RETRACTOR WND ALEXIS-O 25 LRG (MISCELLANEOUS) ×1 IMPLANT
RTRCTR WOUND ALEXIS O 25CM LRG (MISCELLANEOUS) ×2
STAPLER SKIN PROX 35W (STAPLE) ×2 IMPLANT
SUT CHROMIC 0 CT 1 (SUTURE) ×4 IMPLANT
SUT MAXON ABS #0 GS21 30IN (SUTURE) ×4 IMPLANT
SUT SILK 0 (SUTURE) ×1
SUT SILK 0 30XBRD TIE 6 (SUTURE) ×1 IMPLANT
SUT VIC AB 0 CT1 27 (SUTURE) ×2
SUT VIC AB 0 CT1 27XCR 8 STRN (SUTURE) ×2 IMPLANT
SUT VIC AB 4-0 KS 27 (SUTURE) ×2 IMPLANT
TRAY FOLEY MTR SLVR 16FR STAT (SET/KITS/TRAYS/PACK) ×2 IMPLANT
WATER STERILE IRR 1000ML POUR (IV SOLUTION) ×2 IMPLANT

## 2018-09-12 NOTE — Interval H&P Note (Signed)
History and Physical Interval Note:  09/12/2018 10:22 AM  Center  has presented today for surgery, with the diagnosis of PELVIC PAIN, ENLARGES UTERUS  The various methods of treatment have been discussed with the patient and family. After consideration of risks, benefits and other options for treatment, the patient has consented to  Procedure(s): HYSTERECTOMY ABDOMINAL WITH BILATERAL SALPINGO-OOPHORECTOMY (Bilateral) as a surgical intervention .  The patient's history has been reviewed, patient examined, no change in status, stable for surgery.  I have reviewed the patient's chart and labs.  Questions were answered to the patient's satisfaction.     Hassell Done A Tabby Beaston

## 2018-09-12 NOTE — Anesthesia Post-op Follow-up Note (Signed)
Anesthesia QCDR form completed.        

## 2018-09-12 NOTE — Transfer of Care (Signed)
Immediate Anesthesia Transfer of Care Note  Patient: Sarah Savage  Procedure(s) Performed: HYSTERECTOMY ABDOMINAL WITH BILATERAL SALPINGO-OOPHORECTOMY (Bilateral )  Patient Location: PACU  Anesthesia Type:General  Level of Consciousness: drowsy  Airway & Oxygen Therapy: Patient Spontanous Breathing and Patient connected to face mask  Post-op Assessment: Report given to RN and Post -op Vital signs reviewed and stable  Post vital signs: stable  Last Vitals:  Vitals Value Taken Time  BP 138/92 09/12/2018 12:46 PM  Temp 36.4 C 09/12/2018 12:45 PM  Pulse 67 09/12/2018 12:51 PM  Resp 15 09/12/2018 12:51 PM  SpO2 100 % 09/12/2018 12:51 PM  Vitals shown include unvalidated device data.  Last Pain:  Vitals:   09/12/18 1245  TempSrc:   PainSc: Asleep         Complications: No apparent anesthesia complications

## 2018-09-12 NOTE — Anesthesia Postprocedure Evaluation (Signed)
Anesthesia Post Note  Patient: Sarah Savage  Procedure(s) Performed: HYSTERECTOMY ABDOMINAL WITH BILATERAL SALPINGO-OOPHORECTOMY (Bilateral )  Patient location during evaluation: PACU Anesthesia Type: General Level of consciousness: awake and alert and oriented Pain management: pain level controlled Vital Signs Assessment: post-procedure vital signs reviewed and stable Respiratory status: spontaneous breathing Cardiovascular status: blood pressure returned to baseline Anesthetic complications: no     Last Vitals:  Vitals:   09/12/18 0842 09/12/18 1245  BP: (!) 132/98 (!) 138/92  Pulse: 71 68  Resp: 18   Temp: (!) 36.1 C 36.4 C  SpO2: 100% 100%    Last Pain:  Vitals:   09/12/18 1245  TempSrc:   PainSc: Asleep                 Angelize Ryce

## 2018-09-12 NOTE — Anesthesia Procedure Notes (Signed)
Procedure Name: Intubation Date/Time: 09/12/2018 11:01 AM Performed by: Lavone Orn, CRNA Pre-anesthesia Checklist: Patient identified, Emergency Drugs available, Suction available, Patient being monitored and Timeout performed Patient Re-evaluated:Patient Re-evaluated prior to induction Oxygen Delivery Method: Circle system utilized Preoxygenation: Pre-oxygenation with 100% oxygen Induction Type: IV induction Ventilation: Mask ventilation without difficulty Laryngoscope Size: Mac and 4 Grade View: Grade II Tube type: Oral Tube size: 7.0 mm Number of attempts: 1 Airway Equipment and Method: Stylet Placement Confirmation: ETT inserted through vocal cords under direct vision,  positive ETCO2 and breath sounds checked- equal and bilateral Secured at: 21 cm Tube secured with: Tape Dental Injury: Teeth and Oropharynx as per pre-operative assessment

## 2018-09-12 NOTE — Anesthesia Preprocedure Evaluation (Signed)
Anesthesia Evaluation  Patient identified by MRN, date of birth, ID band Patient awake    Reviewed: Allergy & Precautions, NPO status , Patient's Chart, lab work & pertinent test results  Airway Mallampati: II  TM Distance: >3 FB Neck ROM: Full    Dental  (+) Dental Advisory Given   Pulmonary neg pulmonary ROS,    breath sounds clear to auscultation       Cardiovascular hypertension,  Rhythm:Regular Rate:Normal     Neuro/Psych negative neurological ROS     GI/Hepatic Neg liver ROS, PUD,   Endo/Other  negative endocrine ROS  Renal/GU negative Renal ROS     Musculoskeletal   Abdominal   Peds  Hematology negative hematology ROS (+)   Anesthesia Other Findings   Reproductive/Obstetrics                             Lab Results  Component Value Date   WBC 7.5 09/06/2018   HGB 13.0 09/06/2018   HCT 40.8 09/06/2018   MCV 83.6 09/06/2018   PLT 408 (H) 09/06/2018   Lab Results  Component Value Date   CREATININE 0.63 09/06/2018   BUN 16 09/06/2018   NA 141 09/06/2018   K 3.7 09/06/2018   CL 105 09/06/2018   CO2 26 09/06/2018    Anesthesia Physical  Anesthesia Plan  ASA: II  Anesthesia Plan: General   Post-op Pain Management:  Regional for Post-op pain   Induction: Intravenous  PONV Risk Score and Plan: 2 and Propofol infusion and Treatment may vary due to age or medical condition  Airway Management Planned: Oral ETT  Additional Equipment:   Intra-op Plan:   Post-operative Plan: Extubation in OR  Informed Consent: I have reviewed the patients History and Physical, chart, labs and discussed the procedure including the risks, benefits and alternatives for the proposed anesthesia with the patient or authorized representative who has indicated his/her understanding and acceptance.     Plan Discussed with: CRNA  Anesthesia Plan Comments:         Anesthesia Quick  Evaluation

## 2018-09-13 ENCOUNTER — Encounter: Payer: Self-pay | Admitting: Obstetrics and Gynecology

## 2018-09-13 DIAGNOSIS — N838 Other noninflammatory disorders of ovary, fallopian tube and broad ligament: Secondary | ICD-10-CM

## 2018-09-13 DIAGNOSIS — D251 Intramural leiomyoma of uterus: Secondary | ICD-10-CM | POA: Diagnosis not present

## 2018-09-13 DIAGNOSIS — N800 Endometriosis of uterus: Secondary | ICD-10-CM

## 2018-09-13 LAB — HEMOGLOBIN: HEMOGLOBIN: 11.6 g/dL — AB (ref 12.0–15.0)

## 2018-09-13 LAB — ABO/RH: ABO/RH(D): A POS

## 2018-09-13 LAB — SURGICAL PATHOLOGY

## 2018-09-13 MED ORDER — LIDOCAINE 5 % EX PTCH
1.0000 | MEDICATED_PATCH | CUTANEOUS | Status: DC
  Administered 2018-09-13: 1 via TRANSDERMAL
  Filled 2018-09-13: qty 1

## 2018-09-13 MED ORDER — IBUPROFEN 600 MG PO TABS
600.0000 mg | ORAL_TABLET | Freq: Four times a day (QID) | ORAL | Status: DC
  Administered 2018-09-13 – 2018-09-14 (×6): 600 mg via ORAL
  Filled 2018-09-13 (×6): qty 1

## 2018-09-13 MED ORDER — HYDROMORPHONE HCL 2 MG PO TABS: 2.0000 mg | ORAL_TABLET | ORAL | Status: DC | PRN

## 2018-09-13 NOTE — Op Note (Signed)
OPERATIVE NOTE:  Sarah Savage PROCEDURE DATE: 09/13/2018   PREOPERATIVE DIAGNOSIS: 1.  Fibroid uterus 2.  Suspected adenomyosis 3.  History of prior cesarean section  POSTOPERATIVE DIAGNOSIS: 1.  Fibroid uterus 2.  Suspected adenomyosis 3.  History of prior cesarean section  PROCEDURE: TAH/BSO SURGEON:  Brayton Mars, MD ASSISTANTS: Dr. Marcelline Mates ANESTHESIA: General INDICATIONS: 57 y.o. Sarah Savage with history of prior cesarean section delivery, presents for surgical management of symptomatic fibroid uterus and probable adenomyosis.  Patient has Premo history of pelvic pain and abnormal uterine bleeding which has been refractory to medical and surgical therapy.  She is status post endometrial ablation.  FINDINGS: 10-week fibroid uterus; grossly normal fallopian tubes and ovaries   I/O's: Total I/O In: 1211.6 [P.O.:480; I.V.:731.6] Out: 325 [Urine:325] COUNTS:  YES SPECIMENS: Uterus with cervix, bilateral fallopian tubes and bilateral ovaries ANTIBIOTIC PROPHYLAXIS:Ancef 2 grams COMPLICATIONS: None immediate  PROCEDURE IN DETAIL: Patient is breast operative room placed in supine position.  General endotracheal anesthesia was induced that difficulty.  A ChloraPrep and Hibiclens abdominal perineal and intravaginal prep and drape was performed in standard fashion.  Foley catheter was placed into the bladder and was draining clear yellow urine. Timeout was completed. Pfannenstiel incision was made in the abdomen.  The incision was extended transversely down to the level of the fascia which was incised.  This was extended bilaterally with Mayo scissors.  The fascia was dissected off the rectus muscle through sharp and blunt dissection.  The midline raphae was identified, separated and the peritoneum was entered. The bowel was packed off with wet laps.  A Balfour retractor was placed in to the abdominal pelvic cavity to help facilitate exposure.  Kelly clamps were used to grasp  the uterine cornea.  The ureters were identified and noted to be away from the operative field.  The LigaSure impact instrument was used to help facilitate the hysterectomy.  Sequentially the infundibulopelvic ligament was clamped coagulated and cut.  The round ligament was clamped coagulated and cut.  The cardinal broad with complexes were clamped coagulated and cut.  The bladder flap was created over the lower uterine segment through sharp dissection with Metzenbaum scissors.  The uterine arteries which were skeletonized were clamped coagulated and cut.  There was mild adhesions from the previous cesarean section.  Similar procedure was carried out on the contralateral side.  Once down to the level of the cervicovaginal junction, curved Heaney clamps were used to cross-clamp the specimen, and it was excised from the operative field with Mayo scissors.  The angles of the vagina were closed using a Richardson stitch of 0 Vicryl.  The intervening cuff was closed using figure-of-eight sutures of 0 Vicryl.  Irrigation of the pelvis was performed.  Mild oozing at the bladder cuff was cauterized with Bovie cautery.  Good hemostasis was obtained.  After inspection of all pedicles for hemostasis, the retractor and wet laps were removed from the abdominal cavity.  The incision was then closed in layers with 0 Maxon be used on the fashion in a simple running manner.  The skin was closed with a subcuticular stitch of 4-0 Vicryl.  Dermabond glue was placed over the incision.  Lidoderm patch was applied for analgesia.  Pressure dressing was used to cover the incision.  Patient was then awakened extubated taken recovery room in satisfactory condition.  Estimated blood loss was 75 cc.  Urine output was 75 cc.  Complications were none.  Orlando Devereux A. Zipporah Plants, MD, [facility]

## 2018-09-13 NOTE — Progress Notes (Signed)
1 Day Post-Op Procedure(s) (LRB): HYSTERECTOMY ABDOMINAL WITH BILATERAL SALPINGO-OOPHORECTOMY (Bilateral)  Subjective: Patient reports incisional pain.    Objective: I have reviewed patient's vital signs, intake and output, medications and labs.  General: alert, cooperative and no distress GI: soft, non-tender; bowel sounds normal; no masses,  no organomegaly Extremities: extremities normal, atraumatic, no cyanosis or edema Vaginal Bleeding: none CBC Latest Ref Rng & Units 09/13/2018 09/06/2018 06/22/2018  WBC 4.0 - 10.5 K/uL - 7.5 10.8(H)  Hemoglobin 12.0 - 15.0 g/dL 11.6(L) 13.0 11.4(L)  Hematocrit 36.0 - 46.0 % - 40.8 35.8(L)  Platelets 150 - 400 K/uL - 408(H) 299   Assessment: s/p Procedure(s): HYSTERECTOMY ABDOMINAL WITH BILATERAL SALPINGO-OOPHORECTOMY (Bilateral): stable  Plan: Advance diet Encourage ambulation Advance to PO medication Discontinue IV fluids  LOS: 1 day    Alanda Slim Defrancesco 09/13/2018, 8:15 AM

## 2018-09-14 MED ORDER — HYDROMORPHONE HCL 2 MG PO TABS: 2.0000 mg | ORAL_TABLET | ORAL | 0 refills | Status: DC | PRN

## 2018-09-14 MED ORDER — DOCUSATE SODIUM 100 MG PO CAPS: 100.0000 mg | ORAL_CAPSULE | Freq: Two times a day (BID) | ORAL | 0 refills | Status: DC

## 2018-09-14 MED ORDER — IBUPROFEN 600 MG PO TABS: 600.0000 mg | ORAL_TABLET | Freq: Four times a day (QID) | ORAL | 0 refills | Status: DC

## 2018-09-14 MED ORDER — ACETAMINOPHEN 500 MG PO TABS: 1000.0000 mg | ORAL_TABLET | Freq: Four times a day (QID) | ORAL | 0 refills | Status: DC

## 2018-09-14 NOTE — Op Note (Signed)
OPERATIVE NOTE:  Sarah Savage PROCEDURE DATE: 09/14/2018  ADDENDUM: Due to the complex nature of the surgical procedure, the lack of availability of a qualified first assistant, the procedure was performed with the assistance of Dr. Marcelline Mates.   Shandell Giovanni A. Zipporah Plants, MD, [facility]

## 2018-09-14 NOTE — Progress Notes (Signed)
Discharged to home via wheelchair.  To car via nursing staff.  Rx given for home use.

## 2018-09-14 NOTE — Discharge Summary (Signed)
Physician Discharge Summary  Patient ID: Sarah Savage MRN: [id number] DOB/AGE: 1961-05-08 57 y.o.  Admit date: 09/12/2018 Discharge date: 09/14/2018  Admission Diagnoses: Symptomatic Fibroids Suspected Adenomyosis H/O C/S  Discharge Diagnoses:  Active Problems:   S/P TAH-BSO   Discharged Condition: good  Hospital Course: uncomplicated  Consults: None  Significant Diagnostic Studies: labs:  CBC Latest Ref Rng & Units 09/13/2018 09/06/2018 06/22/2018  WBC 4.0 - 10.5 K/uL - 7.5 10.8(H)  Hemoglobin 12.0 - 15.0 g/dL 11.6(L) 13.0 11.4(L)  Hematocrit 36.0 - 46.0 % - 40.8 35.8(L)  Platelets 150 - 400 K/uL - 408(H) 299    Treatments: surgery: TAHBSO  Discharge Exam: Blood pressure 135/88, pulse 71, temperature 97.6 F (36.4 C), temperature source Oral, resp. rate 18, height 5\' 6"  (1.676 m), weight 88 kg, SpO2 97 %. General appearance: alert, cooperative and no distress GI: soft, non-tender; bowel sounds normal; no masses,  no organomegaly Extremities: extremities normal, atraumatic, no cyanosis or edema Skin: Skin color, texture, turgor normal. No rashes or lesions Incision/Wound:no drainage/erythema - pfannenstiel incision  Disposition: Discharge disposition: 01-Home or Self Care       Discharge Instructions    Discharge patient   Complete by:  As directed    Discharge disposition:  01-Home or Self Care   Discharge patient date:  09/14/2018     Allergies as of 09/14/2018      Reactions   Penicillins Other (See Comments)   "I gives me a yeast infection" Has patient had a PCN reaction causing immediate rash, facial/tongue/throat swelling, SOB or lightheadedness with hypotension: No Has patient had a PCN reaction causing severe rash involving mucus membranes or skin necrosis: No Has patient had a PCN reaction that required hospitalization: No Has patient had a PCN reaction occurring within the last 10 years: No If all of the above answers are "NO", then  may proceed with Cephalosporin use.      Medication List    STOP taking these medications   diclofenac 50 MG EC tablet Commonly known as:  VOLTAREN     TAKE these medications   acetaminophen 500 MG tablet Commonly known as:  TYLENOL Take 2 tablets (1,000 mg total) by mouth every 6 (six) hours. What changed:    medication strength  how much to take  when to take this  reasons to take this   docusate sodium 100 MG capsule Commonly known as:  COLACE Take 1 capsule (100 mg total) by mouth 2 (two) times daily.   HYDROmorphone 2 MG tablet Commonly known as:  DILAUDID Take 1 tablet (2 mg total) by mouth every 3 (three) hours as needed for moderate pain or severe pain.   ibuprofen 600 MG tablet Commonly known as:  ADVIL,MOTRIN Take 1 tablet (600 mg total) by mouth 4 (four) times daily. What changed:    medication strength  how much to take  when to take this  reasons to take this   NASAL SPRAY NA Place 2 sprays into the nose daily as needed (allergies).   predniSONE 10 MG tablet Commonly known as:  DELTASONE Taper [id number]      Follow-up Information    Go to [facility], Alanda Slim, MD.   Specialties:  Obstetrics and Gynecology, Radiology Why:  Post Op Check Contact information: [place] [address] [phone]           Signed: Alanda Slim Thadeus Gandolfi 09/14/2018, 4:46 PM

## 2018-09-28 ENCOUNTER — Encounter: Payer: Self-pay | Admitting: Obstetrics and Gynecology

## 2018-09-28 ENCOUNTER — Ambulatory Visit (INDEPENDENT_AMBULATORY_CARE_PROVIDER_SITE_OTHER): Payer: 59 | Admitting: Obstetrics and Gynecology

## 2018-09-28 VITALS — BP 148/88 | HR 73 | Ht 66.0 in | Wt 189.7 lb

## 2018-09-28 DIAGNOSIS — Z90722 Acquired absence of ovaries, bilateral: Secondary | ICD-10-CM

## 2018-09-28 DIAGNOSIS — Z9079 Acquired absence of other genital organ(s): Secondary | ICD-10-CM

## 2018-09-28 DIAGNOSIS — Z90710 Acquired absence of both cervix and uterus: Secondary | ICD-10-CM

## 2018-09-28 DIAGNOSIS — Z09 Encounter for follow-up examination after completed treatment for conditions other than malignant neoplasm: Secondary | ICD-10-CM

## 2018-09-28 NOTE — Progress Notes (Signed)
Chief complaint: 1.  Postop check 2.  Status post TAH/BSO on 09/12/2018  Patient is doing well with normal bowel bladder function.  She is not taking any analgesics at this time.  She has not had any significant drainage from her incision.  Pathology from surgery: DIAGNOSIS:  A. UTERUS WITH CERVIX, BILATERAL FALLOPIAN TUBES AND OVARIES; TOTAL  HYSTERECTOMY WITH BILATERAL SALPINGO-OOPHORECTOMY:  - UTERINE CERVIX: CHRONICALLY INFLAMED TRANSFORMATION ZONE; NEGATIVE FOR  DYSPLASIA AND MALIGNANCY.  - ENDOMETRIUM: WEAKLY PROLIFERATIVE ENDOMETRIUM; NEGATIVE FOR ATYPICAL  HYPERPLASIA/EIN AND MALIGNANCY.  - MYOMETRIUM: ADENOMYOSIS; LEIOMYOMA UTERI; NEGATIVE FOR FEATURES OF  MALIGNANCY.  - FALLOPIAN TUBES: BENIGN PARATUBAL CYSTS; OTHERWISE NO SIGNIFICANT  HISTOPATHOLOGIC CHANGE.  - OVARIES: BENIGN PHYSIOLOGIC CHANGES.   OBJECTIVE: BP (!) 148/88   Pulse 73   Ht 5\' 6"  (1.676 m)   Wt 189 lb 11.2 oz (86 kg)   LMP  (LMP Unknown)   BMI 30.62 kg/m  Pleasant well-appearing female no acute distress. Abdomen: Soft, nontender; Pfannenstiel incision is well approximated; residual Dermabond glue is removed.  No evidence of hernia.  No evidence of erythema or drainage. Pelvic: Deferred  ASSESSMENT: 1.  Normal postop check 2 weeks status post TAH/BSO for symptomatic endometriosis  PLAN: 1.  Resume activities as tolerated.  Avoid heavy lifting for another 2 weeks. 2.  Return in 4 weeks for final postop check with Dr. Ellamae Sia, MD  Note: This dictation was prepared with Dragon dictation along with smaller phrase technology. Any transcriptional errors that result from this process are unintentional.

## 2018-09-28 NOTE — Patient Instructions (Signed)
1.  Return in 4 weeks for final postop check with Dr. Marcelline Mates

## 2018-10-05 DIAGNOSIS — M25562 Pain in left knee: Secondary | ICD-10-CM | POA: Diagnosis not present

## 2018-10-05 DIAGNOSIS — M1711 Unilateral primary osteoarthritis, right knee: Secondary | ICD-10-CM | POA: Diagnosis not present

## 2018-10-13 DIAGNOSIS — J019 Acute sinusitis, unspecified: Secondary | ICD-10-CM | POA: Diagnosis not present

## 2018-10-13 DIAGNOSIS — H61303 Acquired stenosis of external ear canal, unspecified, bilateral: Secondary | ICD-10-CM | POA: Diagnosis not present

## 2018-10-13 DIAGNOSIS — H6123 Impacted cerumen, bilateral: Secondary | ICD-10-CM | POA: Diagnosis not present

## 2018-10-13 DIAGNOSIS — M25562 Pain in left knee: Secondary | ICD-10-CM | POA: Diagnosis not present

## 2018-12-28 ENCOUNTER — Other Ambulatory Visit: Payer: Self-pay | Admitting: Internal Medicine

## 2018-12-28 DIAGNOSIS — Z1231 Encounter for screening mammogram for malignant neoplasm of breast: Secondary | ICD-10-CM

## 2019-01-03 ENCOUNTER — Ambulatory Visit: Payer: 59 | Admitting: Obstetrics and Gynecology

## 2019-01-03 ENCOUNTER — Encounter: Payer: Self-pay | Admitting: Obstetrics and Gynecology

## 2019-01-03 VITALS — BP 143/93 | HR 76 | Ht 66.0 in | Wt 198.4 lb

## 2019-01-03 DIAGNOSIS — B372 Candidiasis of skin and nail: Secondary | ICD-10-CM | POA: Diagnosis not present

## 2019-01-03 DIAGNOSIS — N941 Unspecified dyspareunia: Secondary | ICD-10-CM

## 2019-01-03 DIAGNOSIS — N930 Postcoital and contact bleeding: Secondary | ICD-10-CM | POA: Diagnosis not present

## 2019-01-03 DIAGNOSIS — N952 Postmenopausal atrophic vaginitis: Secondary | ICD-10-CM

## 2019-01-03 DIAGNOSIS — Z90710 Acquired absence of both cervix and uterus: Secondary | ICD-10-CM

## 2019-01-03 MED ORDER — NYSTATIN-TRIAMCINOLONE 100000-0.1 UNIT/GM-% EX CREA: 1.0000 "application " | TOPICAL_CREAM | Freq: Two times a day (BID) | CUTANEOUS | 0 refills | Status: DC

## 2019-01-03 NOTE — Patient Instructions (Signed)
Atrophic Vaginitis  Atrophic vaginitis is a condition in which the tissues that line the vagina become dry and thin. This condition is most common in women who have stopped having regular menstrual periods (are in menopause). This usually starts when a woman is 45-58 years old. That is the time when a woman's estrogen levels begin to drop (decrease). Estrogen is a female hormone. It helps to keep the tissues of the vagina moist. It stimulates the vagina to produce a clear fluid that lubricates the vagina for sexual intercourse. This fluid also protects the vagina from infection. Lack of estrogen can cause the lining of the vagina to get thinner and dryer. The vagina may also shrink in size. It may become less elastic. Atrophic vaginitis tends to get worse over time as a woman's estrogen level drops. What are the causes? This condition is caused by the normal drop in estrogen that happens around the time of menopause. What increases the risk? Certain conditions or situations may lower a woman's estrogen level, leading to a higher risk for atrophic vaginitis. You are more likely to develop this condition if:  You are taking medicines that block estrogen.  You have had your ovaries removed.  You are being treated for cancer with X-ray (radiation) or medicines (chemotherapy).  You have given birth or are breastfeeding.  You are older than age 50.  You smoke. What are the signs or symptoms? Symptoms of this condition include:  Pain, soreness, or bleeding during sexual intercourse (dyspareunia).  Vaginal burning, irritation, or itching.  Pain or bleeding when a speculum is used in a vaginal exam (pelvic exam).  Having burning pain when passing urine.  Vaginal discharge that is brown or yellow. In some cases, there are no symptoms. How is this diagnosed? This condition is diagnosed by taking a medical history and doing a physical exam. This will include a pelvic exam that checks the  vaginal tissues. Though rare, you may also have other tests, including:  A urine test.  A test that checks the acid balance in your vagina (acid balance test). How is this treated? Treatment for this condition depends on how severe your symptoms are. Treatment may include:  Using an over-the-counter vaginal lubricant before sex.  Using a [place]-acting vaginal moisturizer.  Using low-dose vaginal estrogen for moderate to severe symptoms that do not respond to other treatments. Options include creams, tablets, and inserts (vaginal rings). Before you use a vaginal estrogen, tell your health care provider if you have a history of: ? Breast cancer. ? Endometrial cancer. ? Blood clots. If you are not sexually active and your symptoms are very mild, you may not need treatment. Follow these instructions at home: Medicines  Take over-the-counter and prescription medicines only as told by your health care provider. Do not use herbal or alternative medicines unless your health care provider says that you can.  Use over-the-counter creams, lubricants, or moisturizers for dryness only as directed by your health care provider. General instructions  If your atrophic vaginitis is caused by menopause, discuss all of your menopause symptoms and treatment options with your health care provider.  Do not douche.  Do not use products that can make your vagina dry. These include: ? Scented feminine sprays. ? Scented tampons. ? Scented soaps.  Vaginal intercourse can help to improve blood flow and elasticity of vaginal tissue. If it hurts to have sex, try using a lubricant or moisturizer just before having intercourse. Contact a health care provider if:    Your discharge looks different than normal.  Your vagina has an unusual smell.  You have new symptoms.  Your symptoms do not improve with treatment.  Your symptoms get worse. Summary  Atrophic vaginitis is a condition in which the tissues that  line the vagina become dry and thin. It is most common in women who have stopped having regular menstrual periods (are in menopause).  Treatment options include using vaginal lubricants and low-dose vaginal estrogen.  Contact a health care provider if your vagina has an unusual smell, or if your symptoms get worse or do not improve after treatment. This information is not intended to replace advice given to you by your health care provider. Make sure you discuss any questions you have with your health care provider. Document Released: 02/26/2015 Document Revised: 07/08/2017 Document Reviewed: 07/08/2017 Elsevier Interactive Patient Education  2019 [employer].    Skin Yeast Infection  A skin yeast infection is a condition in which there is an overgrowth of yeast (candida) that normally lives on the skin. This condition usually occurs in areas of the skin that are constantly warm and moist, such as the armpits or the groin. What are the causes? This condition is caused by a change in the normal balance of the yeast and bacteria that live on the skin. What increases the risk? You are more likely to develop this condition if you:  Are obese.  Are pregnant.  Take birth control pills.  Have diabetes.  Take antibiotic medicines.  Take steroid medicines.  Are malnourished.  Have a weak body defense system (immune system).  Are 40 years of age or older.  Wear tight clothing. What are the signs or symptoms? The most common symptom of this condition is itchiness in the affected area. Other symptoms include:  Red, swollen area of the skin.  Bumps on the skin. How is this diagnosed?  This condition is diagnosed with a medical history and physical exam.  Your health care provider may check for yeast by taking light scrapings of the skin to be viewed under a microscope. How is this treated? This condition is treated with medicine. Medicines may be prescribed or be available over  the counter. The medicines may be:  Taken by mouth (orally).  Applied as a cream or powder to your skin. Follow these instructions at home:   Take or apply over-the-counter and prescription medicines only as told by your health care provider.  Maintain a healthy weight. If you need help losing weight, talk with your health care provider.  Keep your skin clean and dry.  If you have diabetes, keep your blood sugar under control.  Keep all follow-up visits as told by your health care provider. This is important. Contact a health care provider if:  Your symptoms go away and then return.  Your symptoms do not get better with treatment.  Your symptoms get worse.  Your rash spreads.  You have a fever or chills.  You have new symptoms.  You have new warmth or redness of your skin. Summary  A skin yeast infection is a condition in which there is an overgrowth of yeast (candida) that normally lives on the skin. This condition is caused by a change in the normal balance of the yeast and bacteria that live on the skin.  Take or apply over-the-counter and prescription medicines only as told by your health care provider.  Keep your skin clean and dry.  Contact a health care provider if your symptoms  do not get better with treatment. This information is not intended to replace advice given to you by your health care provider. Make sure you discuss any questions you have with your health care provider. Document Released: 06/30/2011 Document Revised: 03/01/2018 Document Reviewed: 03/01/2018 Elsevier Interactive Patient Education  2019 [employer].

## 2019-01-03 NOTE — Progress Notes (Signed)
    GYNECOLOGY PROGRESS NOTE  Subjective:    Patient ID: Sarah Savage, female    DOB: 08/05/1961, 58 y.o.   MRN: [id number]  HPI  Patient is a 58 y.o. [phone] female who presents for complaints of itching between and underneath breasts for the past month.  She denies any rashes or lesions. She is also complaining of postcoital bleeding and painful intercourse since December, after having a hysterectomy (TAH/BSO) in November 2019. She denies vaginal discharge, lesions. She does report use of lubricants when active.   The following portions of the patient's history were reviewed and updated as appropriate: allergies, current medications, past family history, past medical history, past social history, past surgical history and problem list.  Review of Systems Pertinent items noted in HPI and remainder of comprehensive ROS otherwise negative.   Objective:   Blood pressure (!) 143/93, pulse 76, height 5\' 6"  (1.676 m), weight 198 lb 6.4 oz (90 kg). General appearance: alert and no distress  Breasts: breasts with darkening of skin underneath breasts and along mid-chest, shiny.  No suspicious masses, no skin or nipple changes or axillary nodes. Abdomen: soft, non-tender; bowel sounds normal; no masses,  no organomegaly Pelvic: external genitalia normal, rectovaginal septum normal.  Vagina without discharge, mild vaginal atrophy noted.  Uterus, cervix, and adnexae surgically absent.  Extremities: extremities normal, atraumatic, no cyanosis or edema   Assessment:   Skin candidiasis of breasts Vaginal atrophy Postcoital bleeding s/p hysterectomy Dyspareunia  Plan:   - Patient with likely skin yeast infection.  Will prescribe Mycolog treatment, to apply for at least 1 week BID, until area and symptoms resolve.    - Discussed that postcoital bleeding was likely from vaginal atrophy.  Is currently using lubricants with no relief of symptoms.  Discussed local hormonal treatment of atrophy,  including vaginal estrogen, testosterone, or oral SERM treatments.  Patient desires to try estrogen cream.  Given samples of Premarin (as well as samples of Uberlube).  To f/u in 4 weeks to reassess symptoms.    Rubie Maid, MD [facility]

## 2019-01-03 NOTE — Progress Notes (Signed)
Pt is present today due to having vaginal bleeding after intercourse. Pt stated that she has been noticing the bleeding a month after her hysterectomy in November 2019 x 2 months. Pt stated that sexually intercourse is painful. Pt also stated that she has itching under her breast but no rash. Pt stated that she has not changed soaps or laundry detergent. Pt stated that she has not tried anything for the itching.,

## 2019-01-04 ENCOUNTER — Encounter: Payer: Self-pay | Admitting: Obstetrics and Gynecology

## 2019-01-04 DIAGNOSIS — L2089 Other atopic dermatitis: Secondary | ICD-10-CM | POA: Diagnosis not present

## 2019-01-06 DIAGNOSIS — Z96651 Presence of right artificial knee joint: Secondary | ICD-10-CM | POA: Diagnosis not present

## 2019-01-06 DIAGNOSIS — M1712 Unilateral primary osteoarthritis, left knee: Secondary | ICD-10-CM | POA: Diagnosis not present

## 2019-01-06 DIAGNOSIS — Z471 Aftercare following joint replacement surgery: Secondary | ICD-10-CM | POA: Diagnosis not present

## 2019-01-09 ENCOUNTER — Ambulatory Visit: Payer: Self-pay

## 2019-01-11 ENCOUNTER — Encounter: Payer: 59 | Admitting: Obstetrics and Gynecology

## 2019-01-12 ENCOUNTER — Telehealth: Payer: Self-pay | Admitting: Obstetrics and Gynecology

## 2019-01-12 ENCOUNTER — Telehealth: Payer: Self-pay

## 2019-01-12 ENCOUNTER — Ambulatory Visit: Payer: Self-pay

## 2019-01-12 NOTE — Telephone Encounter (Signed)
Pt called no answer LM via voicemail that I did not see any medication that MAD had prescribed her that was a suppository and advised pt to call the office and inform someone of the name of the medication that she needs a refill of.

## 2019-01-12 NOTE — Telephone Encounter (Signed)
Pt called back and stated that she picked up two creams from the drug store, both of them stated that it was for rash. Pt was unable to tell me the name of the medication that she pick up but kept yelling at me stating that I wasn't listening to her. Pt was informed that I knew Dr. Marcelline Mates gave her samples of premarin and the pt was yelling stated that there was no way those little tubes could last her 30 days and she needed more medication but was unable to give detail of the medication that she needed. Pt continued to yell at me and I asked her to please call me when she gets home and let me know what the name of the medication that she picked up from the pharmacy.

## 2019-01-12 NOTE — Telephone Encounter (Signed)
The patient states she did not get her suppository for her itching, but she did get the cream.  She is asking that the suppository that Dr. Tennis Must used to prescribe be refilled and sent to her pharmacy in her chart, please advise, thanks.

## 2019-01-13 ENCOUNTER — Telehealth: Payer: Self-pay | Admitting: Obstetrics and Gynecology

## 2019-01-13 NOTE — Telephone Encounter (Signed)
The patient called and stated that she has a few concerns in regards to a prescription she was expecting to get called in "Trime (Suposetory)" script for 30 days. The patient is requesting a call  Back if possible to address the script issue. Please advise.

## 2019-01-16 NOTE — Telephone Encounter (Signed)
Spoke with pt and explained that a prescribition for premarin cream would be sent to her pharmacy. [facility] in [place].

## 2019-01-31 ENCOUNTER — Encounter: Payer: 59 | Admitting: Obstetrics and Gynecology

## 2019-02-20 ENCOUNTER — Ambulatory Visit: Payer: Self-pay

## 2019-02-27 DIAGNOSIS — J301 Allergic rhinitis due to pollen: Secondary | ICD-10-CM | POA: Diagnosis not present

## 2019-02-27 DIAGNOSIS — H6123 Impacted cerumen, bilateral: Secondary | ICD-10-CM | POA: Diagnosis not present

## 2019-03-23 ENCOUNTER — Encounter: Payer: 59 | Admitting: Obstetrics and Gynecology

## 2019-03-24 ENCOUNTER — Encounter: Payer: 59 | Admitting: Obstetrics and Gynecology

## 2019-03-28 ENCOUNTER — Ambulatory Visit
Admission: RE | Admit: 2019-03-28 | Discharge: 2019-03-28 | Disposition: A | Discharge status: Home / Self Care | Payer: 59 | Source: Ambulatory Visit | Attending: Internal Medicine | Admitting: Internal Medicine

## 2019-03-28 ENCOUNTER — Other Ambulatory Visit: Payer: Self-pay

## 2019-03-28 DIAGNOSIS — Z1231 Encounter for screening mammogram for malignant neoplasm of breast: Secondary | ICD-10-CM | POA: Insufficient documentation

## 2019-05-29 ENCOUNTER — Encounter: Payer: 59 | Admitting: Obstetrics and Gynecology

## 2019-05-31 ENCOUNTER — Encounter: Payer: 59 | Admitting: Obstetrics and Gynecology

## 2019-07-24 ENCOUNTER — Ambulatory Visit
Admission: EM | Admit: 2019-07-24 | Discharge: 2019-07-24 | Disposition: A | Discharge status: Home / Self Care | Payer: 59 | Attending: Internal Medicine | Admitting: Internal Medicine

## 2019-07-24 ENCOUNTER — Ambulatory Visit (INDEPENDENT_AMBULATORY_CARE_PROVIDER_SITE_OTHER): Payer: 59

## 2019-07-24 ENCOUNTER — Other Ambulatory Visit: Payer: Self-pay

## 2019-07-24 DIAGNOSIS — M79675 Pain in left toe(s): Secondary | ICD-10-CM

## 2019-07-24 DIAGNOSIS — W2209XA Striking against other stationary object, initial encounter: Secondary | ICD-10-CM

## 2019-07-24 DIAGNOSIS — S92512A Displaced fracture of proximal phalanx of left lesser toe(s), initial encounter for closed fracture: Secondary | ICD-10-CM

## 2019-07-24 MED ORDER — IBUPROFEN 600 MG PO TABS: 600.0000 mg | ORAL_TABLET | Freq: Four times a day (QID) | ORAL | 0 refills | Status: DC | PRN

## 2019-07-24 NOTE — ED Provider Notes (Signed)
[facility]    CSN: [id number] Arrival date & time: 07/24/19  1619      History   Chief Complaint Chief Complaint  Patient presents with  . Toe Pain    HPI Sarah Savage is a 58 y.o. female with a history of hypertension Controlled comes to urgent care with complains of left middle toe pain of 1 day duration.  Patient hit her foot against an object last night.  Following that she has severe sharp pain in the left middle toe.  Pain is worse when trying to bear weight.  No known relieving factors.  Patient recently had left knee surgery and is currently undergoing physical therapy.  No swelling of the left foot.  No radiation of pain.  No numbness or bruising.  HPI  Past Medical History:  Diagnosis Date  . Hyperglycemia   . Hyperlipidemia   . Hypertension   . Plantar fasciitis     Patient Active Problem List   Diagnosis Date Noted  . S/P TAH-BSO 09/12/2018  . History of cesarean section 08/11/2018  . Obese 06/22/2018  . S/P right UKR 06/21/2018  . Menopause 03/16/2018  . Bulky or enlarged uterus 03/16/2018  . Trichomonas vaginitis 11/16/2017  . Bacterial vaginosis 09/28/2017  . Increased BMI 02/11/2016  . Vaginal atrophy 02/11/2016  . Leg pain, bilateral 06/03/2015  . BP (high blood pressure) 04/07/2014  . Blood glucose elevated 04/07/2014  . HLD (hyperlipidemia) 04/07/2014  . Plantar fasciitis 04/07/2014  . Gastroduodenal ulcer 04/07/2014  . LBP (low back pain) 04/11/2013  . Arthralgia, sacroiliac 04/11/2013    Past Surgical History:  Procedure Laterality Date  . ABDOMINAL HYSTERECTOMY    . ENDOMETRIAL ABLATION    . HYSTERECTOMY ABDOMINAL WITH SALPINGO-OOPHORECTOMY Bilateral 09/12/2018   Procedure: HYSTERECTOMY ABDOMINAL WITH BILATERAL SALPINGO-OOPHORECTOMY;  Surgeon: Brayton Mars, MD;  Location: [facility] ORS;  Service: Gynecology;  Laterality: Bilateral;  . PARTIAL KNEE ARTHROPLASTY Right 06/21/2018   Procedure: RIGHT UNICOMPARTMENTAL  KNEE-MEDIALLY;  Surgeon: Paralee Cancel, MD;  Location: [facility];  Service: Orthopedics;  Laterality: Right;  70 mins  . STOMACH SURGERY     excision of ulcer  . toenail removal   1990s  . UPPER GI ENDOSCOPY      OB History    Gravida  4   Para  1   Term  1   Preterm      AB  3   Living  1     SAB      TAB  3   Ectopic      Multiple      Live Births  1            Home Medications    Prior to Admission medications   Medication Sig Start Date End Date Taking? Authorizing Provider  nystatin-triamcinolone (MYCOLOG II) cream Apply 1 application topically 2 (two) times daily. 01/03/19  Yes Rubie Maid, MD  ibuprofen (ADVIL) 600 MG tablet Take 1 tablet (600 mg total) by mouth every 6 (six) hours as needed. 07/24/19   LampteyMyrene Galas, MD    Family History Family History  Problem Relation Age of Onset  . Hypertension Mother   . Dementia Mother   . Hypertension Father   . Arthritis Father   . Stroke Brother   . Hypertension Brother   . Diabetes Brother   . Cancer Maternal Grandmother        Breast  . Breast cancer Maternal Grandmother 60    Social  History Social History   Tobacco Use  . Smoking status: Never Smoker  . Smokeless tobacco: Never Used  Substance Use Topics  . Alcohol use: Yes    Alcohol/week: 0.0 standard drinks    Comment: occassional  . Drug use: No     Allergies   Penicillins   Review of Systems Review of Systems  Constitutional: Negative.   HENT: Negative.   Respiratory: Negative.   Gastrointestinal: Negative.   Genitourinary: Negative.   Skin: Negative.   Neurological: Negative.      Physical Exam Triage Vital Signs ED Triage Vitals  Enc Vitals Group     BP 07/24/19 1701 (!) 154/103     Pulse Rate 07/24/19 1701 76     Resp 07/24/19 1701 16     Temp 07/24/19 1701 98.7 F (37.1 C)     Temp Source 07/24/19 1701 Oral     SpO2 07/24/19 1701 100 %     Weight 07/24/19 1659 199 lb (90.3 kg)     Height 07/24/19 1659  5\' 6"  (1.676 m)     Head Circumference --      Peak Flow --      Pain Score 07/24/19 1658 8     Pain Loc --      Pain Edu? --      Excl. in [place]? --    No data found.  Updated Vital Signs BP (!) 154/103 (BP Location: Right Arm)   Pulse 76   Temp 98.7 F (37.1 C) (Oral)   Resp 16   Ht 5\' 6"  (1.676 m)   Wt 90.3 kg   LMP  (LMP Unknown)   SpO2 100%   BMI 32.12 kg/m   Visual Acuity Right Eye Distance:   Left Eye Distance:   Bilateral Distance:    Right Eye Near:   Left Eye Near:    Bilateral Near:     Physical Exam Vitals signs and nursing note reviewed.  Constitutional:      General: She is in acute distress.     Appearance: She is not ill-appearing or toxic-appearing.  Cardiovascular:     Rate and Rhythm: Normal rate and regular rhythm.     Pulses: Normal pulses.     Heart sounds: Normal heart sounds.  Pulmonary:     Effort: Pulmonary effort is normal.     Breath sounds: Normal breath sounds.  Abdominal:     General: Bowel sounds are normal.     Palpations: Abdomen is soft.  Musculoskeletal:        General: Tenderness and signs of injury present. No swelling or deformity.  Skin:    General: Skin is warm.     Capillary Refill: Capillary refill takes less than 2 seconds.     Findings: No bruising, erythema or lesion.  Neurological:     General: No focal deficit present.     Mental Status: She is alert and oriented to person, place, and time.      UC Treatments / Results  Labs (all labs ordered are listed, but only abnormal results are displayed) Labs Reviewed - No data to display  EKG   Radiology Dg Toe 3rd Left  Result Date: 07/24/2019 CLINICAL DATA:  Injury with toe pain EXAM: LEFT THIRD TOE COMPARISON:  None. FINDINGS: Acute fracture involving the proximal shaft and metaphysis of the third proximal phalanx with minimal displacement. No subluxation. IMPRESSION: Acute minimally displaced fracture involving proximal shaft and metaphysis of the third  proximal phalanx. Electronically  Signed   By: Donavan Foil M.D.   On: 07/24/2019 17:47    Procedures Procedures (including critical care time)  Medications Ordered in UC Medications - No data to display  Initial Impression / Assessment and Plan / UC Course  I have reviewed the triage vital signs and the nursing notes.  Pertinent labs & imaging results that were available during my care of the patient were reviewed by me and considered in my medical decision making (see chart for details).     1.  Left middle toe pain: X-ray of the left foot is remarkable for acute minimally displaced fracture of the shaft of the third proximal phalanx Ibuprofen 600mg  every 6 hours as needed Post op boot Follow-up with orthopedic surgeon. If patient's symptoms worsen she can call the orthopedic surgeon's office earlier to be re-evaluated. Final Clinical Impressions(s) / UC Diagnoses   Final diagnoses:  Closed non-physeal fracture of proximal phalanx of lesser toe of left foot, initial encounter   Discharge Instructions   None    ED Prescriptions    Medication Sig Dispense Auth. Provider   ibuprofen (ADVIL) 600 MG tablet Take 1 tablet (600 mg total) by mouth every 6 (six) hours as needed. 30 tablet Lamptey, Myrene Galas, MD     PDMP not reviewed this encounter.   Chase Picket, MD 07/26/19 1155

## 2019-07-24 NOTE — ED Triage Notes (Signed)
Patient complains of left middle toe pain that occurred after hitting her foot last night. Patient states that she had left knee surgery 07/27. Patient states that has tried to soak her foot without much relief.

## 2019-10-27 HISTORY — PX: BREAST REDUCTION SURGERY: SHX8

## 2019-10-27 HISTORY — PX: ABDOMINOPLASTY: SUR9

## 2019-10-31 ENCOUNTER — Encounter: Payer: 59 | Admitting: Obstetrics and Gynecology

## 2019-11-02 ENCOUNTER — Other Ambulatory Visit: Payer: Self-pay

## 2019-11-02 ENCOUNTER — Encounter: Payer: Self-pay | Admitting: Obstetrics and Gynecology

## 2019-11-02 ENCOUNTER — Ambulatory Visit (INDEPENDENT_AMBULATORY_CARE_PROVIDER_SITE_OTHER): Payer: 59 | Admitting: Obstetrics and Gynecology

## 2019-11-02 VITALS — BP 158/92 | HR 108 | Ht 66.0 in | Wt 210.6 lb

## 2019-11-02 DIAGNOSIS — Z780 Asymptomatic menopausal state: Secondary | ICD-10-CM

## 2019-11-02 DIAGNOSIS — Z90710 Acquired absence of both cervix and uterus: Secondary | ICD-10-CM | POA: Diagnosis not present

## 2019-11-02 DIAGNOSIS — Z01419 Encounter for gynecological examination (general) (routine) without abnormal findings: Secondary | ICD-10-CM

## 2019-11-02 DIAGNOSIS — N952 Postmenopausal atrophic vaginitis: Secondary | ICD-10-CM | POA: Diagnosis not present

## 2019-11-02 DIAGNOSIS — E669 Obesity, unspecified: Secondary | ICD-10-CM

## 2019-11-02 NOTE — Patient Instructions (Signed)
Preventive Care 40-59 Years Old, Female Preventive care refers to visits with your health care provider and lifestyle choices that can promote health and wellness. This includes:  A yearly physical exam. This may also be called an annual well check.  Regular dental visits and eye exams.  Immunizations.  Screening for certain conditions.  Healthy lifestyle choices, such as eating a healthy diet, getting regular exercise, not using drugs or products that contain nicotine and tobacco, and limiting alcohol use. What can I expect for my preventive care visit? Physical exam Your health care provider will check your:  Height and weight. This may be used to calculate body mass index (BMI), which tells if you are at a healthy weight.  Heart rate and blood pressure.  Skin for abnormal spots. Counseling Your health care provider may ask you questions about your:  Alcohol, tobacco, and drug use.  Emotional well-being.  Home and relationship well-being.  Sexual activity.  Eating habits.  Work and work environment.  Method of birth control.  Menstrual cycle.  Pregnancy history. What immunizations do I need?  Influenza (flu) vaccine  This is recommended every year. Tetanus, diphtheria, and pertussis (Tdap) vaccine  You may need a Td booster every 10 years. Varicella (chickenpox) vaccine  You may need this if you have not been vaccinated. Zoster (shingles) vaccine  You may need this after age 59. Measles, mumps, and rubella (MMR) vaccine  You may need at least one dose of MMR if you were born in 1957 or later. You may also need a second dose. Pneumococcal conjugate (PCV13) vaccine  You may need this if you have certain conditions and were not previously vaccinated. Pneumococcal polysaccharide (PPSV23) vaccine  You may need one or two doses if you smoke cigarettes or if you have certain conditions. Meningococcal conjugate (MenACWY) vaccine  You may need this if you  have certain conditions. Hepatitis A vaccine  You may need this if you have certain conditions or if you travel or work in places where you may be exposed to hepatitis A. Hepatitis B vaccine  You may need this if you have certain conditions or if you travel or work in places where you may be exposed to hepatitis B. Haemophilus influenzae type b (Hib) vaccine  You may need this if you have certain conditions. Human papillomavirus (HPV) vaccine  If recommended by your health care provider, you may need three doses over 6 months. You may receive vaccines as individual doses or as more than one vaccine together in one shot (combination vaccines). Talk with your health care provider about the risks and benefits of combination vaccines. What tests do I need? Blood tests  Lipid and cholesterol levels. These may be checked every 5 years, or more frequently if you are over 50 years old.  Hepatitis C test.  Hepatitis B test. Screening  Lung cancer screening. You may have this screening every year starting at age 55 if you have a 30-pack-year history of smoking and currently smoke or have quit within the past 15 years.  Colorectal cancer screening. All adults should have this screening starting at age 50 and continuing until age 75. Your health care provider may recommend screening at age 45 if you are at increased risk. You will have tests every 1-10 years, depending on your results and the type of screening test.  Diabetes screening. This is done by checking your blood sugar (glucose) after you have not eaten for a while (fasting). You may have this   done every 1-3 years.  Mammogram. This may be done every 1-2 years. Talk with your health care provider about when you should start having regular mammograms. This may depend on whether you have a family history of breast cancer.  BRCA-related cancer screening. This may be done if you have a family history of breast, ovarian, tubal, or peritoneal  cancers.  Pelvic exam and Pap test. This may be done every 3 years starting at age 59. Starting at age 59, this may be done every 5 years if you have a Pap test in combination with an HPV test. Other tests  Sexually transmitted disease (STD) testing.  Bone density scan. This is done to screen for osteoporosis. You may have this scan if you are at high risk for osteoporosis. Follow these instructions at home: Eating and drinking  Eat a diet that includes fresh fruits and vegetables, whole grains, lean protein, and low-fat dairy.  Take vitamin and mineral supplements as recommended by your health care provider.  Do not drink alcohol if: ? Your health care provider tells you not to drink. ? You are pregnant, may be pregnant, or are planning to become pregnant.  If you drink alcohol: ? Limit how much you have to 0-1 drink a day. ? Be aware of how much alcohol is in your drink. In the [place] one drink equals one 12 oz bottle of beer (355 mL), one 5 oz glass of wine (148 mL), or one 1 oz glass of hard liquor (44 mL). Lifestyle  Take daily care of your teeth and gums.  Stay active. Exercise for at least 30 minutes on 5 or more days each week.  Do not use any products that contain nicotine or tobacco, such as cigarettes, e-cigarettes, and chewing tobacco. If you need help quitting, ask your health care provider.  If you are sexually active, practice safe sex. Use a condom or other form of birth control (contraception) in order to prevent pregnancy and STIs (sexually transmitted infections).  If told by your health care provider, take low-dose aspirin daily starting at age 59. What's next?  Visit your health care provider once a year for a well check visit.  Ask your health care provider how often you should have your eyes and teeth checked.  Stay up to date on all vaccines. This information is not intended to replace advice given to you by your health care provider. Make sure you  discuss any questions you have with your health care provider. Document Revised: 06/23/2018 Document Reviewed: 06/23/2018 Elsevier Patient Education  2020 [place] Breast self-awareness is knowing how your breasts look and feel. Doing breast self-awareness is important. It allows you to catch a breast problem early while it is still small and can be treated. All women should do breast self-awareness, including women who have had breast implants. Tell your doctor if you notice a change in your breasts. What you need:  A mirror.  A well-lit room. How to do a breast self-exam A breast self-exam is one way to learn what is normal for your breasts and to check for changes. To do a breast self-exam: Look for changes  1. Take off all the clothes above your waist. 2. Stand in front of a mirror in a room with good lighting. 3. Put your hands on your hips. 4. Push your hands down. 5. Look at your breasts and nipples in the mirror to see if one breast or nipple looks different from the  other. Check to see if: ? The shape of one breast is different. ? The size of one breast is different. ? There are wrinkles, dips, and bumps in one breast and not the other. 6. Look at each breast for changes in the skin, such as: ? Redness. ? Scaly areas. 7. Look for changes in your nipples, such as: ? Liquid around the nipples. ? Bleeding. ? Dimpling. ? Redness. ? A change in where the nipples are. Feel for changes  1. Lie on your back on the floor. 2. Feel each breast. To do this, follow these steps: ? Pick a breast to feel. ? Put the arm closest to that breast above your head. ? Use your other arm to feel the nipple area of your breast. Feel the area with the pads of your three middle fingers by making small circles with your fingers. For the first circle, press lightly. For the second circle, press harder. For the third circle, press even harder. ? Keep making circles with  your fingers at the different pressures as you move down your breast. Stop when you feel your ribs. ? Move your fingers a little toward the center of your body. ? Start making circles with your fingers again, this time going up until you reach your collarbone. ? Keep making up-and-down circles until you reach your armpit. Remember to keep using the three pressures. ? Feel the other breast in the same way. 3. Sit or stand in the tub or shower. 4. With soapy water on your skin, feel each breast the same way you did in step 2 when you were lying on the floor. Write down what you find Writing down what you find can help you remember what to tell your doctor. Write down:  What is normal for each breast.  Any changes you find in each breast, including: ? The kind of changes you find. ? Whether you have pain. ? Size and location of any lumps.  When you last had your menstrual period. General tips  Check your breasts every month.  If you are breastfeeding, the best time to check your breasts is after you feed your baby or after you use a breast pump.  If you get menstrual periods, the best time to check your breasts is 5-7 days after your menstrual period is over.  With time, you will become comfortable with the self-exam, and you will begin to know if there are changes in your breasts. Contact a doctor if you:  See a change in the shape or size of your breasts or nipples.  See a change in the skin of your breast or nipples, such as red or scaly skin.  Have fluid coming from your nipples that is not normal.  Find a lump or thick area that was not there before.  Have pain in your breasts.  Have any concerns about your breast health. Summary  Breast self-awareness includes looking for changes in your breasts, as well as feeling for changes within your breasts.  Breast self-awareness should be done in front of a mirror in a well-lit room.  You should check your breasts every month.  If you get menstrual periods, the best time to check your breasts is 5-7 days after your menstrual period is over.  Let your doctor know of any changes you see in your breasts, including changes in size, changes on the skin, pain or tenderness, or fluid from your nipples that is not normal. This information is not  intended to replace advice given to you by your health care provider. Make sure you discuss any questions you have with your health care provider. Document Revised: 05/31/2018 Document Reviewed: 05/31/2018 Elsevier Patient Education  [place].

## 2019-11-02 NOTE — Progress Notes (Signed)
Pt present for annual exam. Pt stated that she was doing well no problems. Pt had flu vaccine Oct 2020.

## 2019-11-02 NOTE — Progress Notes (Signed)
ANNUAL PREVENTATIVE CARE GYNECOLOGY  ENCOUNTER NOTE  Subjective:       Sarah Savage is a 59 y.o. [phone] female here for a routine annual gynecologic exam. The patient is sexually active. The patient is not taking hormone replacement therapy. Patient denies post-menopausal vaginal bleeding. The patient wears seatbelts: yes. Has the patient ever been transfused or tattooed?: no.    Current complaints: 1.  Patient questions if having a tummy tuck is something she should do. Notes she has been thinking about this for "some time". Her PCP has recently started her on some weight loss pills.     Gynecologic History No LMP recorded (lmp unknown). Patient has had a hysterectomy. Contraception: post menopausal status and patient has had a hysterectomy. Last Pap: 02/11/2016. Results were: normal. H/o abnormal pap in 2016 (ASCUS, HR HPV+, with normal colposcopy) History of STI's: Trichomonas in 10/2017.  Last mammogram: 03/28/2019. Results were: normal Last Colonoscopy: Up to date, per patient.  Last Dexa Scan: 06/27/2019. Results were: normal.    Obstetric History OB History  Gravida Para Term Preterm AB Living  4 1 1   3 1   SAB TAB Ectopic Multiple Live Births    3     1    # Outcome Date GA Lbr Len/2nd Weight Sex Delivery Anes PTL Lv  4 Term 1991   9 lb 12.8 oz (4.445 kg) M CS-LTranv   LIV  3 TAB 1990          2 TAB 1985          1 TAB             Past Medical History:  Diagnosis Date  . Arthritis   . Hyperglycemia   . Hyperlipidemia   . Hypertension   . Plantar fasciitis     Family History  Problem Relation Age of Onset  . Hypertension Mother   . Dementia Mother   . Hypertension Father   . Arthritis Father   . Stroke Brother   . Hypertension Brother   . Diabetes Brother   . Cancer Maternal Grandmother        Breast  . Breast cancer Maternal Grandmother 72    Past Surgical History:  Procedure Laterality Date  . ENDOMETRIAL ABLATION    . HYSTERECTOMY  ABDOMINAL WITH SALPINGO-OOPHORECTOMY Bilateral 09/12/2018   Procedure: HYSTERECTOMY ABDOMINAL WITH BILATERAL SALPINGO-OOPHORECTOMY;  Surgeon: Brayton Mars, MD;  Location: [facility] ORS;  Service: Gynecology;  Laterality: Bilateral;  . PARTIAL KNEE ARTHROPLASTY Right 06/21/2018   Procedure: RIGHT UNICOMPARTMENTAL KNEE-MEDIALLY;  Surgeon: Paralee Cancel, MD;  Location: [facility] ORS;  Service: Orthopedics;  Laterality: Right;  70 mins  . STOMACH SURGERY     excision of ulcer  . toenail removal   1990s  . UPPER GI ENDOSCOPY      Social History   Socioeconomic History  . Marital status: Married    Spouse name: Not on file  . Number of children: Not on file  . Years of education: Not on file  . Highest education level: Not on file  Occupational History  . Not on file  Tobacco Use  . Smoking status: Never Smoker  . Smokeless tobacco: Never Used  Substance and Sexual Activity  . Alcohol use: Not Currently    Alcohol/week: 0.0 standard drinks    Comment: occassional  . Drug use: No  . Sexual activity: Not Currently    Birth control/protection: Surgical    Comment: hysterectomy  Other Topics Concern  .  Not on file  Social History Narrative  . Not on file   Social Determinants of Health   Financial Resource Strain:   . Difficulty of Paying Living Expenses: Not on file  Food Insecurity:   . Worried About Charity fundraiser in the Last Year: Not on file  . Ran Out of Food in the Last Year: Not on file  Transportation Needs:   . Lack of Transportation (Medical): Not on file  . Lack of Transportation (Non-Medical): Not on file  Physical Activity:   . Days of Exercise per Week: Not on file  . Minutes of Exercise per Session: Not on file  Stress:   . Feeling of Stress : Not on file  Social Connections:   . Frequency of Communication with Friends and Family: Not on file  . Frequency of Social Gatherings with Friends and Family: Not on file  . Attends Religious Services: Not on file   . Active Member of Clubs or Organizations: Not on file  . Attends Archivist Meetings: Not on file  . Marital Status: Not on file  Intimate Partner Violence:   . Fear of Current or Ex-Partner: Not on file  . Emotionally Abused: Not on file  . Physically Abused: Not on file  . Sexually Abused: Not on file    Current Outpatient Medications on File Prior to Visit  Medication Sig Dispense Refill  . ibuprofen (ADVIL) 600 MG tablet Take 1 tablet (600 mg total) by mouth every 6 (six) hours as needed. 30 tablet 0  . nystatin-triamcinolone (MYCOLOG II) cream Apply 1 application topically 2 (two) times daily. 30 g 0   No current facility-administered medications on file prior to visit.    Allergies  Allergen Reactions  . Penicillins Other (See Comments)    "I gives me a yeast infection" Has patient had a PCN reaction causing immediate rash, facial/tongue/throat swelling, SOB or lightheadedness with hypotension: No Has patient had a PCN reaction causing severe rash involving mucus membranes or skin necrosis: No Has patient had a PCN reaction that required hospitalization: No Has patient had a PCN reaction occurring within the last 10 years: No If all of the above answers are "NO", then may proceed with Cephalosporin use.       Review of Systems ROS Review of Systems - General ROS: negative for - chills, fatigue, fever, hot flashes, night sweats, weight gain or weight loss Psychological ROS: negative for - anxiety, decreased libido, depression, mood swings, physical abuse or sexual abuse Ophthalmic ROS: negative for - blurry vision, eye pain or loss of vision ENT ROS: negative for - headaches, hearing change, visual changes or vocal changes Allergy and Immunology ROS: negative for - hives, itchy/watery eyes or seasonal allergies Hematological and Lymphatic ROS: negative for - bleeding problems, bruising, swollen lymph nodes or weight loss Endocrine ROS: negative for -  galactorrhea, hair pattern changes, hot flashes, malaise/lethargy, mood swings, palpitations, polydipsia/polyuria, skin changes, temperature intolerance or unexpected weight changes Breast ROS: negative for - new or changing breast lumps or nipple discharge Respiratory ROS: negative for - cough or shortness of breath Cardiovascular ROS: negative for - chest pain, irregular heartbeat, palpitations or shortness of breath Gastrointestinal ROS: no abdominal pain, change in bowel habits, or black or bloody stools Genito-Urinary ROS: no dysuria, trouble voiding, or hematuria Musculoskeletal ROS: negative for - joint pain or joint stiffness Neurological ROS: negative for - bowel and bladder control changes Dermatological ROS: negative for rash and skin lesion  changes   Objective:   BP (!) 158/92   Pulse (!) 108   Ht 5\' 6"  (1.676 m)   Wt 210 lb 9.6 oz (95.5 kg)   LMP  (LMP Unknown)   BMI 33.99 kg/m  CONSTITUTIONAL: Well-developed, well-nourished female in no acute distress.  Mild obesity.  PSYCHIATRIC: Normal mood and affect. Normal behavior. Normal judgment and thought content. [place]: Alert and oriented to person, place, and time. Normal muscle tone coordination. No cranial nerve deficit noted. HENT:  Normocephalic, atraumatic, External right and left ear normal. Oropharynx is clear and moist EYES: Conjunctivae and EOM are normal. Pupils are equal, round, and reactive to light. No scleral icterus.  NECK: Normal range of motion, supple, no masses.  Normal thyroid.  SKIN: Skin is warm and dry. No rash noted. Not diaphoretic. No erythema. No pallor. CARDIOVASCULAR: Normal heart rate noted, regular rhythm, no murmur. RESPIRATORY: Clear to auscultation bilaterally. Effort and breath sounds normal, no problems with respiration noted. BREASTS: Symmetric in size. No masses, skin changes, nipple drainage, or lymphadenopathy. ABDOMEN: Soft, normal bowel sounds, no distention noted.  No tenderness,  rebound or guarding.  BLADDER: Normal PELVIC:  Bladder no bladder distension noted  Urethra: normal appearing urethra with no masses, tenderness or lesions  Vulva: normal appearing vulva with no masses, tenderness or lesions  Vagina: mildly atrophic vagina with no lesions or discharge  Cervix: surgically absent  Uterus: surgically absent, vaginal cuff well healed  Adnexa: surgically absent bilateral  RV: External Exam NormaI, No Rectal Masses and Normal Sphincter tone  MUSCULOSKELETAL: Normal range of motion. No tenderness.  No cyanosis, clubbing, or edema.  2+ distal pulses. LYMPHATIC: No Axillary, Supraclavicular, or Inguinal Adenopathy.   Labs: Lab Results  Component Value Date   WBC 7.5 09/06/2018   HGB 11.6 (L) 09/13/2018   HCT 40.8 09/06/2018   MCV 83.6 09/06/2018   PLT 408 (H) 09/06/2018    Lab Results  Component Value Date   CREATININE 0.63 09/06/2018   BUN 16 09/06/2018   NA 141 09/06/2018   K 3.7 09/06/2018   CL 105 09/06/2018   CO2 26 09/06/2018    No results found for: ALT, AST, GGT, ALKPHOS, BILITOT  No results found for: CHOL, HDL, LDLCALC, LDLDIRECT, TRIG, CHOLHDL  No results found for: TSH  No results found for: HGBA1C   Assessment:  Encounter for well woman exam with routine gynecological exam S/P hysterectomy Menopause Vaginal atrophy Obesity (BMI 30.0-34.9)  Plan:  Pap: Not needed. Patient is s/p hysteretomy Mammogram: Not Indicated. Up to date.  Stool Guaiac Testing:  Not Indicated. Colonoscopy up to date.  Labs: To be performed by PCP.  Routine preventative health maintenance measures emphasized: Exercise/Diet/Weight control, Tobacco Warnings, Alcohol/Substance use risks, Stress Management, Peer Pressure Issues and Safe Sex Patient currently undergoing weight management with her PCP. Continued to encourage. Also discussed that after desired weight achieved, can consider tummy tuck if desired.  Flu vaccine up to date.  Discussed  questions/concerns regarding COVID vaccination.  Return to [place], MD  [facility]

## 2019-11-03 ENCOUNTER — Encounter: Payer: Self-pay | Admitting: Obstetrics and Gynecology

## 2019-12-23 ENCOUNTER — Ambulatory Visit: Payer: 59 | Attending: Internal Medicine

## 2019-12-23 DIAGNOSIS — Z23 Encounter for immunization: Secondary | ICD-10-CM | POA: Insufficient documentation

## 2019-12-23 NOTE — Progress Notes (Signed)
   Covid-19 Vaccination Clinic  Name:  Sarah Savage    MRN: [id number] DOB: May 25, 1961  12/23/2019  Sarah Savage was observed post Covid-19 immunization for 15 minutes without incidence. She was provided with Vaccine Information Sheet and instruction to access the V-Safe system.   Sarah Savage was instructed to call 911 with any severe reactions post vaccine: Marland Kitchen Difficulty breathing  . Swelling of your face and throat  . A fast heartbeat  . A bad rash all over your body  . Dizziness and weakness    Immunizations Administered    Name Date Dose VIS Date Route   Moderna COVID-19 Vaccine 12/23/2019 11:15 AM 0.5 mL 09/26/2019 Intramuscular   Manufacturer: Moderna   Lot: [id number]   [id number]

## 2020-01-01 ENCOUNTER — Telehealth: Payer: Self-pay | Admitting: Obstetrics and Gynecology

## 2020-01-01 NOTE — Telephone Encounter (Signed)
Patient called saying she wanted to see Dr. Marcelline Mates for shoulder pain and wanted to see about surgery options. Unsure of how to go about scheduling her. How would we schedule her?  Thank you

## 2020-01-02 NOTE — Telephone Encounter (Signed)
What type of surgery did she want to see [facility] about? She needs to see her PCP for her shoulder pain and AC for surgery if it is concerning the vaginal area.  If so she would be scheduled as a surgery consult and then what type of surgery she is having.  Thanks [employer]

## 2020-01-16 ENCOUNTER — Other Ambulatory Visit: Payer: Self-pay

## 2020-01-16 ENCOUNTER — Encounter: Payer: Self-pay | Admitting: Obstetrics and Gynecology

## 2020-01-16 ENCOUNTER — Ambulatory Visit: Payer: 59 | Admitting: Obstetrics and Gynecology

## 2020-01-16 VITALS — BP 136/81 | HR 71 | Ht 66.0 in | Wt 206.0 lb

## 2020-01-16 DIAGNOSIS — E65 Localized adiposity: Secondary | ICD-10-CM | POA: Diagnosis not present

## 2020-01-16 DIAGNOSIS — N62 Hypertrophy of breast: Secondary | ICD-10-CM | POA: Diagnosis not present

## 2020-01-16 DIAGNOSIS — M25511 Pain in right shoulder: Secondary | ICD-10-CM

## 2020-01-16 DIAGNOSIS — M25512 Pain in left shoulder: Secondary | ICD-10-CM

## 2020-01-16 DIAGNOSIS — G8929 Other chronic pain: Secondary | ICD-10-CM

## 2020-01-16 NOTE — Progress Notes (Signed)
Pt present to see her provider. Pt disclosed the reason for her visit.

## 2020-01-16 NOTE — Progress Notes (Signed)
    GYNECOLOGY PROGRESS NOTE  Subjective:    Patient ID: Sarah Savage, female    DOB: 1961-04-29, 59 y.o.   MRN: [id number]  HPI  Patient is a 59 y.o. [id number] female who presents for discussion of shoulder pain and neck pain, due to her large breasts.  She notes that her bra size has slowly been increasing over the past year or so.  Previously wore a 38D, but now is up to G sizing. Notes that her bra straps have been digging into her shoulders to the point where she now has significan indentations. Is having a much harder time finding bras that fit and is becoming very costly.  Has had issues in the past with skin yeast infections under her breasts and increased sweating.   Additionally, patient also questions whether anything can be done regarding her pannus.  Notes similar issues over the years with skin infections leading to darkening of her skin.   The following portions of the patient's history were reviewed and updated as appropriate: allergies, current medications, past family history, past medical history, past social history, past surgical history and problem list.  Review of Systems Pertinent items noted in HPI and remainder of comprehensive ROS otherwise negative.   Objective:   Blood pressure 136/81, pulse 71, height 5\' 6"  (1.676 m), weight 206 lb (93.4 kg).  Body mass index is 33.25 kg/m.  General appearance: alert and no distress  Breasts: normal appearing but large breasts, descent to mid-abdomen, bust size 40 cm.  Extremities: Deep indentations along shoulders, with hyperpigmentation along bilateral acromion.  Abdomen: soft, non-tender; bowel sounds normal; no masses,  no organomegaly. Pannus with mild hyperpigmentation.  Assessment:   1. Chronic pain of both shoulders   2. Large breasts   3. Abdominal pannus    Plan:   1. Patient desires intervention for her abdominal pannus and large breasts due to increasing size, h/o skin infections and back/shoulder  pain. Will refer to General Surgeon for consultation for mammoplasty (and possibly abdominoplasty).   Return to clinic for any scheduled appointments or for any gynecologic concerns as needed.     Rubie Maid, MD [facility]

## 2020-01-16 NOTE — Patient Instructions (Signed)
Breast Reduction Breast reduction, also called reduction mammoplasty, is surgery to reduce the size of the breasts by removing fat, tissue, and excess skin. The goal of this procedure is to help relieve problems that are caused or made worse by large breasts, such as:  Poor posture.  Bovey-term (chronic) back and neck pain.  Difficulty exercising.  A rash on the skin under the breasts.  Breast pain.  Grooves in the shoulder from bra straps.  Difficulty with hygiene.  Psychological distress caused by large breasts. Before the procedure, you and your surgeon will decide on a plan for the new size of your breasts. Tell a health care provider about:  Any allergies you have.  All medicines you are taking, including vitamins, herbs, eye drops, creams, and over-the-counter medicines.  Any problems you or family members have had with anesthetic medicines.  Any blood disorders you have.  Any surgeries you have had.  Any medical conditions you have.  Whether you are pregnant or may be pregnant. What are the risks? Generally, this is a safe procedure. However, problems may occur, including:  Infection.  Bleeding. Blood may pool near the incision area (hematoma).  Allergic reactions to medicines.  Damage to nearby structures or organs, such as the dark area around the nipple (areola).  Partial or total numbness in the nipple or breast. Sometimes feeling returns, but not always.  Inability to breastfeed.  Pneumonia.  Blood clots. What happens before the procedure? Staying hydrated Follow instructions from your health care provider about hydration, which may include:  Up to 2 hours before the procedure - you may continue to drink clear liquids, such as water, clear fruit juice, black coffee, and plain tea.  Eating and drinking restrictions Follow instructions from your health care provider about eating and drinking, which may include:  8 hours before the procedure - stop  eating heavy meals or foods, such as meat, fried foods, or fatty foods.  6 hours before the procedure - stop eating light meals or foods, such as toast or cereal.  6 hours before the procedure - stop drinking milk or drinks that contain milk.  2 hours before the procedure - stop drinking clear liquids. Medicines Ask your health care provider about:  Changing or stopping your regular medicines. This is especially important if you are taking diabetes medicines or blood thinners.  Taking medicines such as aspirin and ibuprofen. These medicines can thin your blood. Do not take these medicines unless your health care provider tells you to take them.  Taking other over-the-counter medicines, vitamins, herbs, and supplements. Tests You may have tests or exams, such as:  Blood tests.  An X-ray exam of the breasts (mammogram). General instructions  Ask your health care provider: ? How your surgery site will be marked. ? What steps will be taken to help prevent infection. These may include:  Washing skin with a germ-killing soap.  Taking antibiotic medicine.  You may be asked to bathe using a germ-killing soap before your procedure.  Do not use any products that contain nicotine or tobacco for at least 4 weeks before the procedure. These products include cigarettes, e-cigarettes, and chewing tobacco. If you need help quitting, ask your health care provider.  Plan to have someone take you home from the hospital or clinic.  Plan to have a responsible adult care for you for at least 24 hours after you leave the hospital or clinic. This is important. What happens during the procedure?  An IV will  be inserted into one of your veins.  You will be given one or more of the following: ? A medicine to help you relax (sedative). ? A medicine to numb the area (local anesthetic). ? A medicine to make you fall asleep (general anesthetic).  An incision will be made in each breast.  Fat,  tissue, and excess skin will be removed from each breast.  Your breasts will be reshaped. Your nipples may be moved so they are centered on your smaller breasts.  Tubes may be placed in your breast tissue to drain fluid from your surgical area.  Your incisions will be closed with stitches (sutures) and covered with surgical tape. Your breasts will be covered with gauze and elastic bandages (dressings). The procedure may vary among health care providers and hospitals. What happens after the procedure?  Your blood pressure, heart rate, breathing rate, and blood oxygen level will be monitored until you leave the hospital or clinic.  You may continue to receive fluids and medicines through an IV.  You may have to wear compression stockings. These stockings help to prevent blood clots and reduce swelling in your legs.  You may continue to have tubes draining fluid from your surgical area. The tubes will be removed 2-3 days after the surgery.  You may be given a certain type of bra to wear.  You may have breast pain. You will be given medicine to help relieve pain.  Do not drive for 24 hours if you were given a sedative during the procedure. Summary  Breast reduction, also called reduction mammoplasty, is surgery to reduce the size of the breasts by removing fat, tissue, and excess skin.  Tubes may be placed in your breast tissue to drain fluid from your surgical area. The tubes will be removed 2-3 days after the surgery.  Your incisions will be closed with stitches (sutures) and covered with surgical tape. Your breasts will be covered with gauze and elastic bandages (dressings).  You may have breast pain after the procedure. You will be given medicine to help relieve pain.  Plan to have someone take you home from the hospital or clinic. This information is not intended to replace advice given to you by your health care provider. Make sure you discuss any questions you have with your  health care provider. Document Revised: 04/14/2019 Document Reviewed: 04/14/2019 Elsevier Patient Education  [place].

## 2020-01-17 ENCOUNTER — Other Ambulatory Visit: Payer: Self-pay | Admitting: Obstetrics and Gynecology

## 2020-01-17 NOTE — Addendum Note (Signed)
Addended by: Augusto Gamble on: 01/17/2020 08:25 PM   Modules accepted: Orders

## 2020-01-20 ENCOUNTER — Ambulatory Visit: Payer: Self-pay

## 2020-01-30 ENCOUNTER — Other Ambulatory Visit: Payer: Self-pay

## 2020-01-30 ENCOUNTER — Ambulatory Visit: Payer: 59 | Attending: Internal Medicine

## 2020-01-30 ENCOUNTER — Ambulatory Visit: Payer: 59

## 2020-01-30 DIAGNOSIS — Z23 Encounter for immunization: Secondary | ICD-10-CM

## 2020-01-30 NOTE — Progress Notes (Signed)
   Covid-19 Vaccination Clinic  Name:  Sarah Savage    MRN: [id number] DOB: 08/09/1961  01/30/2020  [name] was observed post Covid-19 immunization for 15 minutes without incident. She was provided with Vaccine Information Sheet and instruction to access the V-Safe system.   Ms. Palms was instructed to call 911 with any severe reactions post vaccine: Marland Kitchen Difficulty breathing  . Swelling of face and throat  . A fast heartbeat  . A bad rash all over body  . Dizziness and weakness   Immunizations Administered    Name Date Dose VIS Date Route   Moderna COVID-19 Vaccine 01/30/2020  3:57 PM 0.5 mL 09/26/2019 Intramuscular   Manufacturer: Moderna   Lot: [id number]   [id number]

## 2020-02-15 ENCOUNTER — Encounter: Payer: Self-pay | Admitting: Plastic Surgery

## 2020-02-15 ENCOUNTER — Ambulatory Visit: Payer: 59 | Admitting: Plastic Surgery

## 2020-02-15 ENCOUNTER — Other Ambulatory Visit: Payer: Self-pay

## 2020-02-15 VITALS — BP 142/85 | HR 85 | Temp 98.4°F | Wt 198.0 lb

## 2020-02-15 DIAGNOSIS — M793 Panniculitis, unspecified: Secondary | ICD-10-CM | POA: Diagnosis not present

## 2020-02-15 DIAGNOSIS — N62 Hypertrophy of breast: Secondary | ICD-10-CM | POA: Diagnosis not present

## 2020-02-15 NOTE — Progress Notes (Signed)
Referring Provider Idelle Crouch, MD [facility] [place],  [place] [address]   CC:  Chief Complaint  Patient presents with  . Consult    BL breast reduction tummy tuck      Sarah Savage is an 59 y.o. female.  HPI: Patient is here to discuss breast reduction and panniculectomy.  She is been bothered by her large pendulous breast for years.  She has had back pain neck pain and shoulder grooving related to them.  She has not been to a physical therapist or chiropractor.  Her last mammogram was in December and is normal.  She is a non-smoker and not diabetic.  She is currently a 22 G and wants to be around a D.  She has had rashes beneath the inframammary crease that she is treated with over-the-counter medications with little success.  She does have a maternal grandmother who had breast cancer.  Regarding the abdomen she is bothered by the overhanging skin and rashes that occur in her crease.  She has treated these with over-the-counter medications as well with limited success.  She is also bothered by the fullness in her upper abdomen and wants to discuss what could be done to address that also.  Her only previous abdominal operations are hysterectomy and C-section.  Allergies  Allergen Reactions  . Penicillins Other (See Comments)    "I gives me a yeast infection" Has patient had a PCN reaction causing immediate rash, facial/tongue/throat swelling, SOB or lightheadedness with hypotension: No Has patient had a PCN reaction causing severe rash involving mucus membranes or skin necrosis: No Has patient had a PCN reaction that required hospitalization: No Has patient had a PCN reaction occurring within the last 10 years: No If all of the above answers are "NO", then may proceed with Cephalosporin use.     Outpatient Encounter Medications as of 02/15/2020  Medication Sig  . phentermine (ADIPEX-P) 37.5 MG tablet Take by mouth.  .  nystatin-triamcinolone (MYCOLOG II) cream Apply 1 application topically 2 (two) times daily. (Patient not taking: Reported on 02/15/2020)   No facility-administered encounter medications on file as of 02/15/2020.     Past Medical History:  Diagnosis Date  . Arthritis   . Hyperglycemia   . Hyperlipidemia   . Hypertension   . Plantar fasciitis     Past Surgical History:  Procedure Laterality Date  . ABDOMINAL HYSTERECTOMY    . ENDOMETRIAL ABLATION    . HYSTERECTOMY ABDOMINAL WITH SALPINGO-OOPHORECTOMY Bilateral 09/12/2018   Procedure: HYSTERECTOMY ABDOMINAL WITH BILATERAL SALPINGO-OOPHORECTOMY;  Surgeon: Brayton Mars, MD;  Location: [facility] ORS;  Service: Gynecology;  Laterality: Bilateral;  . PARTIAL KNEE ARTHROPLASTY Right 06/21/2018   Procedure: RIGHT UNICOMPARTMENTAL KNEE-MEDIALLY;  Surgeon: Paralee Cancel, MD;  Location: [facility];  Service: Orthopedics;  Laterality: Right;  70 mins  . STOMACH SURGERY     excision of ulcer  . toenail removal   1990s  . UPPER GI ENDOSCOPY      Family History  Problem Relation Age of Onset  . Hypertension Mother   . Dementia Mother   . Hypertension Father   . Arthritis Father   . Stroke Brother   . Hypertension Brother   . Diabetes Brother   . Cancer Maternal Grandmother        Breast  . Breast cancer Maternal Grandmother 34    Social History   Social History Narrative  . Not on file     Review of  Systems General: Denies fevers, chills, weight loss CV: Denies chest pain, shortness of breath, palpitations  Physical Exam Vitals with BMI 02/15/2020 01/16/2020 11/02/2019  Height - 5\' 6"  5\' 6"   Weight 198 lbs 206 lbs 210 lbs 10 oz  BMI - [id number]  Systolic [id number]  Diastolic 85 81 92  Pulse 85 71 108    General:  No acute distress,  Alert and oriented, Non-Toxic, Normal speech and affect Breast: She has grade 3 ptosis.  Sternal notch to nipple is 35 on both sides.  Nipple to fold is 17 on both sides.  I do not see any  obvious scars or masses. Abdomen: Abdomen is soft nontender.  I not feel any hernias.  She has a lower transverse scar.  She has an overhanging pannus with signs of skin hyperpigmentation in the crease.  She has upper abdominal fullness and skin laxity as well.  She additionally has intra-abdominal fat that pointed out to her as and explained the difference between intra-abdominal and extra-abdominal fat.  Assessment/Plan The patient has bilateral symptomatic macromastia.  She is a good candidate for a breast reduction.  The details of breast reduction surgery were discussed.  I explained the procedure in detail along the with the expected scars.  The risks were discussed in detail and include bleeding, infection, damage to surrounding structures, need for additional procedures, nipple loss, change in nipple sensation, persistent pain, contour irregularities and asymmetries.  I explained that breast feeding is often not possible after breast reduction surgery.  We discussed the expected postoperative course with an overall recovery period of about 1 month.  She demonstrated full understanding of all risks.  We discussed her personal risk factors.  We then discussed the abdominal procedures.  I think she would qualify for insurance coverage infraumbilical panniculectomy.  She wanted to have the upper abdomen addressed it could be thinned out with liposuction but I think her best result would be a full abdominoplasty with plication of the rectus muscles.  We will plan to submit her infraumbilical panniculectomy to insert insurance as she has symptomatic issues related to it and give her a quote for the additional work for the upper abdomen.  I anticipate approximately 800 g of tissue removed from each breast.   Cindra Presume 02/15/2020, 5:10 PM

## 2020-02-29 ENCOUNTER — Other Ambulatory Visit: Payer: Self-pay | Admitting: Internal Medicine

## 2020-02-29 DIAGNOSIS — Z1231 Encounter for screening mammogram for malignant neoplasm of breast: Secondary | ICD-10-CM

## 2020-03-08 DIAGNOSIS — E65 Localized adiposity: Secondary | ICD-10-CM | POA: Insufficient documentation

## 2020-03-08 DIAGNOSIS — N62 Hypertrophy of breast: Secondary | ICD-10-CM | POA: Insufficient documentation

## 2020-03-08 NOTE — Progress Notes (Signed)
ICD-10-CM   1. Macromastia  N62   2. Abdominal pannus  E65       Patient ID: Sarah Savage, female    DOB: 1960-12-07, 59 y.o.   MRN: [id number]   History of Present Illness: Sarah Savage is a 59 y.o.  female  with a history of symptomatic macromastia.  She presents for preoperative evaluation for upcoming procedure, bilateral breast reduction and abdominoplasty with liposuction, scheduled for 03/22/20 with Dr. Claudia Desanctis.  Summary from previous visit: Patient has large breasts contributing to her back and neck pain. She is a 55 G and would like to be a D cup. She is grad 3 ptosis. Sternal notch to nipple distance is 35 cm on both sides. Nipple to IMF is 17 cm on both sides.  Her abdomen upper fullness and skin laxity, c section scar.  Estimated breast tissue to be removed at time of surgery is 800 g from each side.  Job: Research scientist (life sciences)  [place] Significant for: HTN, HLD, hyperglycemia.   The patient has not had problems with anesthesia.   Past Medical History: Allergies: Allergies  Allergen Reactions  . Penicillins Other (See Comments)    "I gives me a yeast infection" Has patient had a PCN reaction causing immediate rash, facial/tongue/throat swelling, SOB or lightheadedness with hypotension: No Has patient had a PCN reaction causing severe rash involving mucus membranes or skin necrosis: No Has patient had a PCN reaction that required hospitalization: No Has patient had a PCN reaction occurring within the last 10 years: No If all of the above answers are "NO", then may proceed with Cephalosporin use.     Current Medications:  Current Outpatient Medications:  .  nystatin-triamcinolone (MYCOLOG II) cream, Apply 1 application topically 2 (two) times daily. (Patient not taking: Reported on 02/15/2020), Disp: 30 g, Rfl: 0 .  phentermine (ADIPEX-P) 37.5 MG tablet, Take by mouth., Disp: , Rfl:   Past Medical Problems: Past Medical History:  Diagnosis Date  . Arthritis     . Hyperglycemia   . Hyperlipidemia   . Hypertension   . Plantar fasciitis     Past Surgical History: Past Surgical History:  Procedure Laterality Date  . ABDOMINAL HYSTERECTOMY    . ENDOMETRIAL ABLATION    . HYSTERECTOMY ABDOMINAL WITH SALPINGO-OOPHORECTOMY Bilateral 09/12/2018   Procedure: HYSTERECTOMY ABDOMINAL WITH BILATERAL SALPINGO-OOPHORECTOMY;  Surgeon: Brayton Mars, MD;  Location: [facility] ORS;  Service: Gynecology;  Laterality: Bilateral;  . PARTIAL KNEE ARTHROPLASTY Right 06/21/2018   Procedure: RIGHT UNICOMPARTMENTAL KNEE-MEDIALLY;  Surgeon: Paralee Cancel, MD;  Location: [facility];  Service: Orthopedics;  Laterality: Right;  70 mins  . STOMACH SURGERY     excision of ulcer  . toenail removal   1990s  . UPPER GI ENDOSCOPY      Social History: Social History   Socioeconomic History  . Marital status: Married    Spouse name: Not on file  . Number of children: Not on file  . Years of education: Not on file  . Highest education level: Not on file  Occupational History  . Not on file  Tobacco Use  . Smoking status: Never Smoker  . Smokeless tobacco: Never Used  Substance and Sexual Activity  . Alcohol use: Not Currently    Alcohol/week: 0.0 standard drinks    Comment: occassional  . Drug use: No  . Sexual activity: Not Currently    Birth control/protection: Surgical    Comment: hysterectomy  Other Topics Concern  .  Not on file  Social History Narrative  . Not on file   Social Determinants of Health   Financial Resource Strain:   . Difficulty of Paying Living Expenses:   Food Insecurity:   . Worried About Charity fundraiser in the Last Year:   . Arboriculturist in the Last Year:   Transportation Needs:   . Film/video editor (Medical):   Marland Kitchen Lack of Transportation (Non-Medical):   Physical Activity:   . Days of Exercise per Week:   . Minutes of Exercise per Session:   Stress:   . Feeling of Stress :   Social Connections:   . Frequency of  Communication with Friends and Family:   . Frequency of Social Gatherings with Friends and Family:   . Attends Religious Services:   . Active Member of Clubs or Organizations:   . Attends Archivist Meetings:   Marland Kitchen Marital Status:   Intimate Partner Violence:   . Fear of Current or Ex-Partner:   . Emotionally Abused:   Marland Kitchen Physically Abused:   . Sexually Abused:     Family History: Family History  Problem Relation Age of Onset  . Hypertension Mother   . Dementia Mother   . Hypertension Father   . Arthritis Father   . Stroke Brother   . Hypertension Brother   . Diabetes Brother   . Cancer Maternal Grandmother        Breast  . Breast cancer Maternal Grandmother 60    Review of Systems: Review of Systems  Constitutional: Negative for chills and fever.  HENT: Negative for congestion and sore throat.   Respiratory: Negative for cough and shortness of breath.   Cardiovascular: Negative for chest pain and palpitations.  Gastrointestinal: Negative for abdominal pain, nausea and vomiting.  Musculoskeletal: Positive for back pain and neck pain. Negative for joint pain and myalgias.  Skin: Negative for itching and rash.    Physical Exam: Vital Signs BP 136/84 (BP Location: Right Arm, Patient Position: Sitting, Cuff Size: Normal)   Pulse 94   Temp 98.4 F (36.9 C) (Temporal)   Ht 5\' 6"  (1.676 m)   Wt 198 lb (89.8 kg)   LMP  (LMP Unknown)   SpO2 100%   BMI 31.96 kg/m  Physical Exam Constitutional:      Appearance: Normal appearance. She is normal weight.  HENT:     Head: Normocephalic and atraumatic.  Eyes:     Extraocular Movements: Extraocular movements intact.  Cardiovascular:     Rate and Rhythm: Normal rate and regular rhythm.     Pulses: Normal pulses.     Heart sounds: Normal heart sounds.  Pulmonary:     Effort: Pulmonary effort is normal.     Breath sounds: Normal breath sounds. No wheezing, rhonchi or rales.  Abdominal:     General: Bowel sounds  are normal.     Palpations: Abdomen is soft.  Musculoskeletal:        General: No swelling. Normal range of motion.     Cervical back: Normal range of motion.  Skin:    General: Skin is warm and dry.     Coloration: Skin is not pale.     Findings: No erythema or rash.  Neurological:     General: No focal deficit present.     Mental Status: She is alert and oriented to person, place, and time.  Psychiatric:        Mood and Affect: Mood normal.  Behavior: Behavior normal.        Thought Content: Thought content normal.        Judgment: Judgment normal.     Assessment/Plan:  Ms. Rostron scheduled for bilateral breast reduction and abdominoplasty with liposuction with Dr. Claudia Desanctis.  Risks, benefits, and alternatives of procedure discussed, questions answered and consent obtained.    Smoking Status: non-smoker Last Mammogram: 03/28/19; Results: No findings suspicious for malignancy Has MM scheduled for 04/09/20. Discussed trying to get this in before the surgery otherwise she should wait 6 months to allow breasts to heal.  Caprini Score: 4 Moderate; Risk Factors include: 59 yr-old female, BMI > 25, and length of planned surgery. Recommendation for mechanical or pharmacological prophylaxis during surgery. Encourage early ambulation.   Pictures obtained: 02/15/20  Post-op Rx sent to pharmacy: Norco, Zofran  Patient was provided with breast reduction and general surgical risk consent documents prior to their appointment.  They had adequate time to read through the consent forms and we also discussed them in person together during this preop appointment.  All of their questions were answered to their content.  Recommended calling if they have any further questions.  Consent form to be scanned into patient's chart.  The risk that can be encountered with breast reduction were discussed and include the following but not limited to these:  Breast asymmetry, fluid accumulation, firmness of the  breast, inability to breast feed, loss of nipple or areola, skin loss, decrease or no nipple sensation, fat necrosis of the breast tissue, bleeding, infection, healing delay.  There are risks of anesthesia, changes to skin sensation and injury to nerves or blood vessels.  The muscle can be temporarily or permanently injured.  You may have an allergic reaction to tape, suture, glue, blood products which can result in skin discoloration, swelling, pain, skin lesions, poor healing.  Any of these can lead to the need for revisonal surgery or stage procedures.  A reduction has potential to interfere with diagnostic procedures.  Nipple or breast piercing can increase risks of infection.  This procedure is best done when the breast is fully developed.  Changes in the breast will continue to occur over time.  Pregnancy can alter the outcomes of previous breast reduction surgery, weight gain and weigh loss can also effect the Hogenson term appearance.   The [place] was signed into law in 2016 which includes the topic of electronic health records.  This provides immediate access to information in MyChart.  This includes consultation notes, operative notes, office notes, lab results and pathology reports.  If you have any questions about what you read please let us know at your next visit or call us at the office.  We are right here with you.   Electronically signed by: Threasa Heads, PA-C 03/13/2020 9:31 PM

## 2020-03-08 NOTE — Patient Instructions (Signed)
Activity As tolerated: NO showers for 3 days No heavy activities. No heavy lifting   Diet: Regular  Wound Care: Keep dressing clean & dry for 3 days.  Keep wrap applied with compression as much as possible 24/7.    Do not change dressings for 3 days unless soiled.  Can change if needed but make sure to reapply wrap. After three days can remove wrap and shower.  Then reapply dressings if needed and continue compression with wrap or soft sports bra.  Call doctor if any unusual problems occur such as pain, excessive bleeding, unrelieved nausea/vomiting, fever &/or chills  Pain Management Plan: 1) Ibuprofen 800 mg every 8 hours         If still having pain... Add 2) Tylenol 500 mg every 8 hours          If still in pain... Add 3) Rx pain medication - Hydorcodone         Recommend @ night         May use up to every 8 hours if needed for severe pain  Follow-up appointment: - surgery 03/22/20 - Post -op visit 6/3 & 6/10

## 2020-03-13 ENCOUNTER — Other Ambulatory Visit: Payer: Self-pay

## 2020-03-13 ENCOUNTER — Encounter: Payer: Self-pay | Admitting: Plastic Surgery

## 2020-03-13 ENCOUNTER — Ambulatory Visit: Payer: 59 | Admitting: Plastic Surgery

## 2020-03-13 DIAGNOSIS — E65 Localized adiposity: Secondary | ICD-10-CM

## 2020-03-13 DIAGNOSIS — N62 Hypertrophy of breast: Secondary | ICD-10-CM

## 2020-03-13 MED ORDER — HYDROCODONE-ACETAMINOPHEN 5-325 MG PO TABS: 1.0000 | ORAL_TABLET | Freq: Three times a day (TID) | ORAL | 0 refills | Status: AC | PRN

## 2020-03-13 MED ORDER — ONDANSETRON HCL 4 MG PO TABS: 4.0000 mg | ORAL_TABLET | Freq: Three times a day (TID) | ORAL | 0 refills | Status: DC | PRN

## 2020-03-14 DIAGNOSIS — Z719 Counseling, unspecified: Secondary | ICD-10-CM

## 2020-03-18 ENCOUNTER — Other Ambulatory Visit: Payer: Self-pay

## 2020-03-18 ENCOUNTER — Ambulatory Visit
Admission: RE | Admit: 2020-03-18 | Discharge: 2020-03-18 | Disposition: A | Discharge status: Home / Self Care | Payer: 59 | Source: Ambulatory Visit | Attending: Internal Medicine | Admitting: Internal Medicine

## 2020-03-18 DIAGNOSIS — Z1231 Encounter for screening mammogram for malignant neoplasm of breast: Secondary | ICD-10-CM | POA: Diagnosis not present

## 2020-03-22 DIAGNOSIS — N62 Hypertrophy of breast: Secondary | ICD-10-CM | POA: Diagnosis not present

## 2020-03-23 ENCOUNTER — Telehealth (INDEPENDENT_AMBULATORY_CARE_PROVIDER_SITE_OTHER): Payer: 59 | Admitting: Plastic Surgery

## 2020-03-23 DIAGNOSIS — N62 Hypertrophy of breast: Secondary | ICD-10-CM

## 2020-03-23 MED ORDER — ONDANSETRON HCL 4 MG PO TABS: 4.0000 mg | ORAL_TABLET | Freq: Three times a day (TID) | ORAL | 0 refills | Status: AC | PRN

## 2020-03-23 MED ORDER — CIPROFLOXACIN HCL 500 MG PO TABS: 500.0000 mg | ORAL_TABLET | Freq: Two times a day (BID) | ORAL | 0 refills | Status: AC

## 2020-03-23 MED ORDER — TRAMADOL HCL 50 MG PO TABS: 50.0000 mg | ORAL_TABLET | Freq: Two times a day (BID) | ORAL | 0 refills | Status: AC | PRN

## 2020-03-23 NOTE — Addendum Note (Signed)
Addended by: Wallace Going on: 03/23/2020 08:43 AM   Modules accepted: Orders

## 2020-03-23 NOTE — Telephone Encounter (Signed)
The patient is a 59 year old female who called this morning with concerns for bleeding.  She underwent bilateral breast reduction and abdominoplasty yesterday.  She was concerned that her incision was opening up on for the right lateral breast area.  I agreed to meet her at the office.  Matt and I performed the following:  Procedure Note  Preoperative Dx: Dehiscence of right breast incision  Postoperative Dx: Same  Procedure: Closure of right breast incision  Anesthesia: Lidocaine 1% with 1:100,000 epinepherine   Description of Procedure: Risks and complications were explained to the patient.  Consent was confirmed and the patient understands the risks and benefits.  The potential complications and alternatives were explained and the patient consents.  The patient expressed understanding the option of not having the procedure and the risks of a scar.  Time out was called and all information was confirmed to be correct.    The area was prepped and drapped with Betadine.  Lidocaine 1% with epinepherine was injected in the subcutaneous area.  The skin edges were reapproximated with 5-0 Monocryl using vertical mattress sutures.  A dressing was applied.  The patient was given instructions on how to care for the area and a follow up appointment.  Charrisse tolerated the procedure well and there were no complications.  Prescriptions were sent into pharmacy as the patient did not pick up the previous prescriptions.  She was encouraged to get a breast binder from [facility] and to pick up her prescriptions from [facility] in [place].

## 2020-03-23 NOTE — Addendum Note (Signed)
Addended by: Wallace Going on: 03/23/2020 08:48 AM   Modules accepted: Orders

## 2020-03-26 ENCOUNTER — Telehealth: Payer: Self-pay

## 2020-03-26 HISTORY — PX: REDUCTION MAMMAPLASTY: SUR839

## 2020-03-26 NOTE — Telephone Encounter (Signed)
Spoke with patient.  She should wear either the binder or some kind of compression garment 24/7 for the first 6 weeks. She can adjust the binder if needed to allow for more comfort.

## 2020-03-26 NOTE — Telephone Encounter (Signed)
Patient called to state she is having discomfort with the band on her abdomen and would like to know if she is able to take it off.

## 2020-03-28 ENCOUNTER — Ambulatory Visit (INDEPENDENT_AMBULATORY_CARE_PROVIDER_SITE_OTHER): Payer: 59 | Admitting: Plastic Surgery

## 2020-03-28 ENCOUNTER — Other Ambulatory Visit: Payer: Self-pay

## 2020-03-28 ENCOUNTER — Encounter: Payer: Self-pay | Admitting: Plastic Surgery

## 2020-03-28 VITALS — BP 138/86 | HR 70 | Temp 97.1°F | Ht 66.0 in | Wt 198.0 lb

## 2020-03-28 DIAGNOSIS — N62 Hypertrophy of breast: Secondary | ICD-10-CM

## 2020-03-28 NOTE — Progress Notes (Signed)
Patient is here 1 week postop from bilateral breast reduction and abdominoplasty.  She is overall doing well.  She had to come to our office on Saturday for couple stitches on the lateral aspect on the right side and she was having some drainage in that area.  Since then she has had no issues.  On exam her breasts look great and the incisions all look intact.  Nipple areolar complexes are viable.  Her abdomen looks to be healing fine also.  All the incisions look to be intact.  There is not a lot of drainage coming out of her drain so the left drain was removed today.  Hopefully the right drain can be removed next week and she knows to bring her output recordings to that visit.  In the interim of asked her to continue to do compressive garments to the breast and abdomen and to continue to take it easy.  We will plan to see her next week.  All her questions were answered.

## 2020-04-02 DIAGNOSIS — Z9889 Other specified postprocedural states: Secondary | ICD-10-CM | POA: Insufficient documentation

## 2020-04-02 NOTE — Progress Notes (Signed)
Patient is a 59 year old female here for follow-up after undergoing a bilateral breast reduction and abdominoplasty with liposuction on 03/22/2020 with Dr. Claudia Desanctis.  ~ 2 weeks PO Patient reports overall she is doing well.  Abdominal incision is healing very well, C/D/I.  Steri-Strips removed.  No signs of infection, drainage, redness.  Patient reports less than 5 cc per 24 hours in the drain for the last several days.  Drain removed today. There is some diffuse swelling remaining across the abdomen, expected at 2 weeks postop.   Bilateral breast incisions are healing very well, C/D/I.  No signs of infection, drainage, redness.  There is some diffuse swelling present of bilateral breasts.  Patient pointed out dog ear at far left lateral breast incision.  Recommend giving some time for this to settle out during healing.  Continue wearing compression garments 24/7 as much as possible.  May shower.  Apply Vaseline and gauze to drain site for the next few days till it closes.  Continue to avoid heavy lifting for at least 2 more weeks.    Patient is going on vacation to [place] on June 23.  Follow-up in the office postop check prior to leaving.  Call office with any questions/concerns.  The [place] was signed into law in 2016 which includes the topic of electronic health records.  This provides immediate access to information in MyChart.  This includes consultation notes, operative notes, office notes, lab results and pathology reports.  If you have any questions about what you read please let us know at your next visit or call us at the office.  We are right here with you.

## 2020-04-04 ENCOUNTER — Encounter: Payer: Self-pay | Admitting: Plastic Surgery

## 2020-04-04 ENCOUNTER — Other Ambulatory Visit: Payer: Self-pay

## 2020-04-04 ENCOUNTER — Ambulatory Visit (INDEPENDENT_AMBULATORY_CARE_PROVIDER_SITE_OTHER): Payer: 59 | Admitting: Plastic Surgery

## 2020-04-04 DIAGNOSIS — Z9889 Other specified postprocedural states: Secondary | ICD-10-CM

## 2020-04-09 ENCOUNTER — Ambulatory Visit: Payer: 59

## 2020-04-14 NOTE — Progress Notes (Signed)
Patient is a 59 year old female here for follow-up after undergoing a bilateral breast reduction and abdominoplasty with liposuction on 03/22/2020 with Dr. Claudia Desanctis.  ~ 3 weeks PO Patient reports overall she is doing well.  Vertical limb incision of right breast has dehisced a little. All other incisions of breasts and abdomen are healing very well, c/d/i. No signs of infection, drainage, redness, seroma/hematoma.  Remaining suture knots removed from right breast lateral end of IMF incision. Removed partially spit staple from left lateral end of abdominal incision. Suture knots removed from umbilical incision.  Do daily dressing changes to right breat vertical limb wound consisting of vaseline and gauze. Secure with tape as desired. May apply vaseline to all other incisions.   Continue to wear compression garments until 6 weeks PO. Continue to avoid heavy lifting. Vacation in [place] June 23. Avoid submerging breasts in water.  Follow up in 3-4 weeks. Call office with any questions/concerns.     Pictures were obtained of the patient and placed in the chart with the patient's or guardian's permission.  The [place] was signed into law in 2016 which includes the topic of electronic health records.  This provides immediate access to information in MyChart.  This includes consultation notes, operative notes, office notes, lab results and pathology reports.  If you have any questions about what you read please let us know at your next visit or call us at the office.  We are right here with you.

## 2020-04-15 ENCOUNTER — Encounter: Payer: Self-pay | Admitting: Plastic Surgery

## 2020-04-15 ENCOUNTER — Other Ambulatory Visit: Payer: Self-pay

## 2020-04-15 ENCOUNTER — Ambulatory Visit (INDEPENDENT_AMBULATORY_CARE_PROVIDER_SITE_OTHER): Payer: 59 | Admitting: Plastic Surgery

## 2020-04-15 VITALS — BP 126/87 | HR 72 | Temp 98.4°F | Ht 66.0 in | Wt 198.0 lb

## 2020-04-15 DIAGNOSIS — Z9889 Other specified postprocedural states: Secondary | ICD-10-CM

## 2020-04-23 NOTE — Progress Notes (Addendum)
Patient is a 59 year old female here for follow-up after undergoing a bilateral breast reduction and abdominoplasty with liposuction on 03/22/2020 with Dr. Claudia Desanctis.  She has had some dehiscence of the right breast vertical limb incision.  ~ 4.5 weeks PO Just got back from vacation in [place].  Right vertical limb incision (3.5x3.5x0.5 cm) is healing slowly.  Moderate amount of exudate present.  No signs of infection.  Patient denies fever, nausea/vomiting.  Breast is not painful upon palpation.  Do daily dressing changes consisting of Xeroform (or Vaseline) and gauze.  All other breast incisions are healing very nicely, C/D/I.  No signs of infection, drainage, redness.  Abdominal incision is healing well, C/D/I.  No signs of redness, drainage, infection, seroma/hematoma.  She has a small dogear on the left lateral end of the incision.  We will watch this to see if it resolves as it continues to heal.  She may apply Vaseline and do circular massage on any areas of firmness along the incision.  Follow-up in 2 to 3 weeks for wound check.  Call office with any questions/concerns.  Request sent to [facility] for Xeroform and Gauze. Patient instructed to call [facility] directly if she hasn't received supplies in 72 hours.   Pictures were obtained of the patient and placed in the chart with the patient's or guardian's permission.   The [place] was signed into law in 2016 which includes the topic of electronic health records.  This provides immediate access to information in MyChart.  This includes consultation notes, operative notes, office notes, lab results and pathology reports.  If you have any questions about what you read please let us know at your next visit or call us at the office.  We are right here with you.

## 2020-04-24 ENCOUNTER — Encounter: Payer: Self-pay | Admitting: Plastic Surgery

## 2020-04-24 ENCOUNTER — Other Ambulatory Visit: Payer: Self-pay

## 2020-04-24 ENCOUNTER — Ambulatory Visit (INDEPENDENT_AMBULATORY_CARE_PROVIDER_SITE_OTHER): Payer: 59 | Admitting: Plastic Surgery

## 2020-04-24 VITALS — BP 128/85

## 2020-04-24 DIAGNOSIS — Z9889 Other specified postprocedural states: Secondary | ICD-10-CM

## 2020-04-24 DIAGNOSIS — Z719 Counseling, unspecified: Secondary | ICD-10-CM

## 2020-05-02 ENCOUNTER — Ambulatory Visit (INDEPENDENT_AMBULATORY_CARE_PROVIDER_SITE_OTHER): Payer: 59 | Admitting: Plastic Surgery

## 2020-05-02 ENCOUNTER — Other Ambulatory Visit: Payer: Self-pay

## 2020-05-02 ENCOUNTER — Encounter: Payer: Self-pay | Admitting: Plastic Surgery

## 2020-05-02 VITALS — BP 138/85 | HR 84 | Temp 97.1°F

## 2020-05-02 DIAGNOSIS — N62 Hypertrophy of breast: Secondary | ICD-10-CM

## 2020-05-02 NOTE — Progress Notes (Signed)
Patient presents about 6 weeks out from a bilateral breast reduction and abdominoplasty.  She overall feels like things are going along okay.  She has had a wound on the right breast along the vertical limb that is been slow to heal.  Otherwise her only other complaint is a dogear towards the back of the breast reduction incision.  On exam the wound on the right side looks to be healing fine.  Its becoming healthier and contracting since the last visit she had.  There is no signs of infection.  There is a few spitting sutures that I removed and an additional spitting suture at the T-junction on that side as well.  There is no purulence.  She does have a small dogear on the lateral aspect of the inframammary breast incision that may need a revision in the future.  The belly overall looks great and has healed well.  We will plan to continue wound care to the right breast and have her follow-up again in 3 to 4 weeks.  All of her questions were answered.  I would like to get but I expect will be her final postop pictures at her next visit.

## 2020-05-14 ENCOUNTER — Ambulatory Visit: Payer: 59 | Admitting: Plastic Surgery

## 2020-05-16 ENCOUNTER — Telehealth: Payer: Self-pay | Admitting: Plastic Surgery

## 2020-05-16 NOTE — Telephone Encounter (Signed)
I called the patient to discuss at 4:10 PM, no answer, I left a voicemail messages stating that I was calling from the plastic surgery office and please call us back.

## 2020-05-16 NOTE — Telephone Encounter (Signed)
Patient said she started feeling a knot last night and wanted someone to advise if she should come in before next week to be evaluated. Please call her back to advise.

## 2020-05-16 NOTE — Telephone Encounter (Signed)
Patient reported that she noticed a knot in her breast, it is a little bit tender to palpation but she reports she otherwise is doing well.  Per her description I notified her that it sounds like it is fat necrosis and I recommend massaging this daily with a little bit of Vaseline.  This is something that is sometimes common and will typically resolve on its own.  We can evaluate it next week during her appointment.  Please call back with any further questions or concerns.

## 2020-05-22 ENCOUNTER — Ambulatory Visit (INDEPENDENT_AMBULATORY_CARE_PROVIDER_SITE_OTHER): Payer: 59 | Admitting: Plastic Surgery

## 2020-05-22 ENCOUNTER — Encounter: Payer: Self-pay | Admitting: Plastic Surgery

## 2020-05-22 ENCOUNTER — Telehealth: Payer: Self-pay

## 2020-05-22 ENCOUNTER — Other Ambulatory Visit: Payer: Self-pay

## 2020-05-22 VITALS — BP 119/81 | HR 72 | Temp 98.0°F | Ht 66.0 in | Wt 190.8 lb

## 2020-05-22 DIAGNOSIS — N62 Hypertrophy of breast: Secondary | ICD-10-CM

## 2020-05-22 NOTE — Progress Notes (Signed)
Patient is now about 8 weeks postop from bilateral breast reduction and abdominoplasty.  She is overall happy with her results but has had a wound along the vertical limb on the right side that has been slow to heal.  On exam this looks to be healing in compared to her last visit.  The remaining open areas about 1 to 1.5 cm in diameter.  Her abdominal incisions of healed very nicely and she has a good result in that area.  She is bothered by a dogear on the left side that is extending towards her back.  I think this is big enough that it would likely require a revision but wanted give it a little bit more time to have it settle out.  She is planning to come back in around a month and at that point if it still bothering her we can plan for a dogear excision on the left side.  All of her questions were answered.

## 2020-05-22 NOTE — Telephone Encounter (Signed)
Called [facility], spoke with Berline Lopes. Ordered more 4x4s and xeroforms

## 2020-06-19 ENCOUNTER — Encounter: Payer: Self-pay | Admitting: Plastic Surgery

## 2020-06-19 ENCOUNTER — Ambulatory Visit (INDEPENDENT_AMBULATORY_CARE_PROVIDER_SITE_OTHER): Payer: BLUE CROSS/BLUE SHIELD | Admitting: Plastic Surgery

## 2020-06-19 ENCOUNTER — Other Ambulatory Visit: Payer: Self-pay

## 2020-06-19 VITALS — BP 118/81 | HR 67 | Temp 98.6°F

## 2020-06-19 DIAGNOSIS — N62 Hypertrophy of breast: Secondary | ICD-10-CM

## 2020-06-19 DIAGNOSIS — Z719 Counseling, unspecified: Secondary | ICD-10-CM

## 2020-06-19 NOTE — Progress Notes (Signed)
Patient is here just shy of 65-month postop from a bilateral breast reduction abdominoplasty.  She feels like she is doing well and is healed up at this point.  On exam the wound on her right breast is totally epithelialized.  The scar is really not bad.  Her shape and symmetry are overall good.  She does complain of a dogear on the left side in the furthest posterior aspect of the incision which we have been watching.  I think at some point she will require a revision of this.  We will plan to schedule this for 3 months from now and see how it goes.  Otherwise she can wear a regular bra and resume all regular activities.  She knows to call back in the meantime if anything should come up.  All of her questions were answered.

## 2020-08-19 ENCOUNTER — Encounter: Payer: Self-pay | Admitting: Plastic Surgery

## 2020-08-19 ENCOUNTER — Ambulatory Visit (INDEPENDENT_AMBULATORY_CARE_PROVIDER_SITE_OTHER): Payer: Self-pay | Admitting: Plastic Surgery

## 2020-08-19 ENCOUNTER — Other Ambulatory Visit: Payer: Self-pay

## 2020-08-19 VITALS — BP 108/73 | HR 74

## 2020-08-19 DIAGNOSIS — Z411 Encounter for cosmetic surgery: Secondary | ICD-10-CM

## 2020-08-19 NOTE — Progress Notes (Signed)
Operative Note   DATE OF OPERATION: 08/19/2020  LOCATION:    SURGICAL DEPARTMENT: Plastic Surgery  PREOPERATIVE DIAGNOSES: Left axillary dogear  POSTOPERATIVE DIAGNOSES:  same  PROCEDURE:  1. Excision of left axillary dogear measuring 10 cm 2. Complex closure measuring 10 cm  SURGEON: Talmadge Coventry, MD  ANESTHESIA:  Local  COMPLICATIONS: None.   INDICATIONS FOR PROCEDURE:  The patient, Sarah Savage is a 59 y.o. female born on Aug 22, 1961, is here for treatment of left axillary dogear after breast reduction MRN: [id number]  CONSENT:  Informed consent was obtained directly from the patient. Risks, benefits and alternatives were fully discussed. Specific risks including but not limited to bleeding, infection, hematoma, seroma, scarring, pain, infection, wound healing problems, and need for further surgery were all discussed. The patient did have an ample opportunity to have questions answered to satisfaction.   DESCRIPTION OF PROCEDURE:  Local anesthesia was administered. The patient's operative site was prepped and draped in a sterile fashion. A time out was performed and all information was confirmed to be correct.  The lesion was excised with a 15 blade.  Hemostasis was obtained.  Circumferential undermining was performed and the skin was advanced and closed in layers with interrupted buried Monocryl sutures and running 3-0 Monocryl subcuticular for the skin.  The lesion excised measured 10 cm, and the total length of closure measured 10 cm.    The patient tolerated the procedure well.  There were no complications.

## 2020-09-01 NOTE — Progress Notes (Deleted)
Patient is here for follow-up after undergoing excision of a left axillary dogear measuring 10 cm on 08/19/20 with Dr. Claudia Desanctis.  She had a bilateral breast reduction and abdominoplasty with liposuction on 03/22/2020 with Dr. Claudia Desanctis.  ~  2 weeks PO

## 2020-09-05 ENCOUNTER — Ambulatory Visit: Payer: BLUE CROSS/BLUE SHIELD | Admitting: Plastic Surgery

## 2020-09-05 DIAGNOSIS — Z9889 Other specified postprocedural states: Secondary | ICD-10-CM

## 2020-09-18 ENCOUNTER — Encounter: Payer: Self-pay | Admitting: Plastic Surgery

## 2020-09-18 ENCOUNTER — Ambulatory Visit (INDEPENDENT_AMBULATORY_CARE_PROVIDER_SITE_OTHER): Payer: BLUE CROSS/BLUE SHIELD | Admitting: Plastic Surgery

## 2020-09-18 ENCOUNTER — Other Ambulatory Visit: Payer: Self-pay

## 2020-09-18 VITALS — BP 127/60 | Temp 98.5°F

## 2020-09-18 DIAGNOSIS — Z9889 Other specified postprocedural states: Secondary | ICD-10-CM

## 2020-09-18 NOTE — Progress Notes (Signed)
Patient is a 59 year old female here for follow-up after undergoing excision of left axillary dogear measuring 10 cm on 08/19/2020 with Dr. Claudia Desanctis.  She presents today with concerns for pain along the incision.  She denies fever/chills nausea/vomiting.  Incision is healing very nicely, C/D/I.  No signs of infection, redness, drainage, seroma/hematoma.  There is a nice appearance to the incision.  She has a very small suture tail poking through the skin that is the cause of her pain.  Suture tail removed today.  Follow-up as needed.  Call office with any questions/concerns.

## 2020-10-23 ENCOUNTER — Other Ambulatory Visit: Payer: Self-pay

## 2020-10-23 ENCOUNTER — Ambulatory Visit
Admission: EM | Admit: 2020-10-23 | Discharge: 2020-10-23 | Disposition: A | Discharge status: Home / Self Care | Payer: BLUE CROSS/BLUE SHIELD | Attending: Internal Medicine | Admitting: Internal Medicine

## 2020-10-23 DIAGNOSIS — Z1152 Encounter for screening for COVID-19: Secondary | ICD-10-CM

## 2020-10-23 NOTE — ED Triage Notes (Signed)
Pt states she is asymptomatic for COVID but husband tested positive.  Requesting COVID test.

## 2020-10-24 LAB — NOVEL CORONAVIRUS, NAA (HOSP ORDER, SEND-OUT TO REF LAB; TAT 18-24 HRS): SARS-CoV-2, NAA: NOT DETECTED

## 2020-11-01 ENCOUNTER — Encounter: Payer: 59 | Admitting: Obstetrics and Gynecology

## 2020-11-28 ENCOUNTER — Encounter: Payer: 59 | Admitting: Obstetrics and Gynecology

## 2021-01-21 ENCOUNTER — Encounter: Payer: Self-pay | Admitting: Obstetrics and Gynecology

## 2021-03-06 ENCOUNTER — Encounter: Payer: Self-pay | Admitting: Obstetrics and Gynecology

## 2021-03-07 ENCOUNTER — Other Ambulatory Visit: Payer: Self-pay | Admitting: Internal Medicine

## 2021-03-07 DIAGNOSIS — Z1231 Encounter for screening mammogram for malignant neoplasm of breast: Secondary | ICD-10-CM

## 2021-03-07 DIAGNOSIS — R55 Syncope and collapse: Secondary | ICD-10-CM

## 2021-03-07 DIAGNOSIS — R519 Headache, unspecified: Secondary | ICD-10-CM

## 2021-03-07 DIAGNOSIS — I10 Essential (primary) hypertension: Secondary | ICD-10-CM

## 2021-03-10 NOTE — Patient Instructions (Signed)
Preventive Care 84-60 Years Old, Female Preventive care refers to lifestyle choices and visits with your health care provider that can promote health and wellness. This includes:  A yearly physical exam. This is also called an annual wellness visit.  Regular dental and eye exams.  Immunizations.  Screening for certain conditions.  Healthy lifestyle choices, such as: ? Eating a healthy diet. ? Getting regular exercise. ? Not using drugs or products that contain nicotine and tobacco. ? Limiting alcohol use. What can I expect for my preventive care visit? Physical exam Your health care provider will check your:  Height and weight. These may be used to calculate your BMI (body mass index). BMI is a measurement that tells if you are at a healthy weight.  Heart rate and blood pressure.  Body temperature.  Skin for abnormal spots. Counseling Your health care provider may ask you questions about your:  Past medical problems.  Family's medical history.  Alcohol, tobacco, and drug use.  Emotional well-being.  Home life and relationship well-being.  Sexual activity.  Diet, exercise, and sleep habits.  Work and work Statistician.  Access to firearms.  Method of birth control.  Menstrual cycle.  Pregnancy history. What immunizations do I need? Vaccines are usually given at various ages, according to a schedule. Your health care provider will recommend vaccines for you based on your age, medical history, and lifestyle or other factors, such as travel or where you work.   What tests do I need? Blood tests  Lipid and cholesterol levels. These may be checked every 5 years, or more often if you are over 3 years old.  Hepatitis C test.  Hepatitis B test. Screening  Lung cancer screening. You may have this screening every year starting at age 73 if you have a 30-pack-year history of smoking and currently smoke or have quit within the past 15 years.  Colorectal cancer  screening. ? All adults should have this screening starting at age 52 and continuing until age 17. ? Your health care provider may recommend screening at age 49 if you are at increased risk. ? You will have tests every 1-10 years, depending on your results and the type of screening test.  Diabetes screening. ? This is done by checking your blood sugar (glucose) after you have not eaten for a while (fasting). ? You may have this done every 1-3 years.  Mammogram. ? This may be done every 1-2 years. ? Talk with your health care provider about when you should start having regular mammograms. This may depend on whether you have a family history of breast cancer.  BRCA-related cancer screening. This may be done if you have a family history of breast, ovarian, tubal, or peritoneal cancers.  Pelvic exam and Pap test. ? This may be done every 3 years starting at age 10. ? Starting at age 11, this may be done every 5 years if you have a Pap test in combination with an HPV test. Other tests  STD (sexually transmitted disease) testing, if you are at risk.  Bone density scan. This is done to screen for osteoporosis. You may have this scan if you are at high risk for osteoporosis. Talk with your health care provider about your test results, treatment options, and if necessary, the need for more tests. Follow these instructions at home: Eating and drinking  Eat a diet that includes fresh fruits and vegetables, whole grains, lean protein, and low-fat dairy products.  Take vitamin and mineral supplements  as recommended by your health care provider.  Do not drink alcohol if: ? Your health care provider tells you not to drink. ? You are pregnant, may be pregnant, or are planning to become pregnant.  If you drink alcohol: ? Limit how much you have to 0-1 drink a day. ? Be aware of how much alcohol is in your drink. In the [place] one drink equals one 12 oz bottle of beer (355 mL), one 5 oz glass of  wine (148 mL), or one 1 oz glass of hard liquor (44 mL).   Lifestyle  Take daily care of your teeth and gums. Brush your teeth every morning and night with fluoride toothpaste. Floss one time each day.  Stay active. Exercise for at least 30 minutes 5 or more days each week.  Do not use any products that contain nicotine or tobacco, such as cigarettes, e-cigarettes, and chewing tobacco. If you need help quitting, ask your health care provider.  Do not use drugs.  If you are sexually active, practice safe sex. Use a condom or other form of protection to prevent STIs (sexually transmitted infections).  If you do not wish to become pregnant, use a form of birth control. If you plan to become pregnant, see your health care provider for a prepregnancy visit.  If told by your health care provider, take low-dose aspirin daily starting at age 75.  Find healthy ways to cope with stress, such as: ? Meditation, yoga, or listening to music. ? Journaling. ? Talking to a trusted person. ? Spending time with friends and family. Safety  Always wear your seat belt while driving or riding in a vehicle.  Do not drive: ? If you have been drinking alcohol. Do not ride with someone who has been drinking. ? When you are tired or distracted. ? While texting.  Wear a helmet and other protective equipment during sports activities.  If you have firearms in your house, make sure you follow all gun safety procedures. What's next?  Visit your health care provider once a year for an annual wellness visit.  Ask your health care provider how often you should have your eyes and teeth checked.  Stay up to date on all vaccines. This information is not intended to replace advice given to you by your health care provider. Make sure you discuss any questions you have with your health care provider. Document Revised: 07/16/2020 Document Reviewed: 06/23/2018 Elsevier Patient Education  2021 [place] Breast self-awareness is knowing how your breasts look and feel. Doing breast self-awareness is important. It allows you to catch a breast problem early while it is still small and can be treated. All women should do breast self-awareness, including women who have had breast implants. Tell your doctor if you notice a change in your breasts. What you need:  A mirror.  A well-lit room. How to do a breast self-exam A breast self-exam is one way to learn what is normal for your breasts and to check for changes. To do a breast self-exam: Look for changes 1. Take off all the clothes above your waist. 2. Stand in front of a mirror in a room with good lighting. 3. Put your hands on your hips. 4. Push your hands down. 5. Look at your breasts and nipples in the mirror to see if one breast or nipple looks different from the other. Check to see if: ? The shape of one breast is different. ? The size of  one breast is different. ? There are wrinkles, dips, and bumps in one breast and not the other. 6. Look at each breast for changes in the skin, such as: ? Redness. ? Scaly areas. 7. Look for changes in your nipples, such as: ? Liquid around the nipples. ? Bleeding. ? Dimpling. ? Redness. ? A change in where the nipples are.   Feel for changes 1. Lie on your back on the floor. 2. Feel each breast. To do this, follow these steps: ? Pick a breast to feel. ? Put the arm closest to that breast above your head. ? Use your other arm to feel the nipple area of your breast. Feel the area with the pads of your three middle fingers by making small circles with your fingers. For the first circle, press lightly. For the second circle, press harder. For the third circle, press even harder. ? Keep making circles with your fingers at the different pressures as you move down your breast. Stop when you feel your ribs. ? Move your fingers a little toward the center of your body. ? Start making  circles with your fingers again, this time going up until you reach your collarbone. ? Keep making up-and-down circles until you reach your armpit. Remember to keep using the three pressures. ? Feel the other breast in the same way. 3. Sit or stand in the tub or shower. 4. With soapy water on your skin, feel each breast the same way you did in step 2 when you were lying on the floor.   Write down what you find Writing down what you find can help you remember what to tell your doctor. Write down:  What is normal for each breast.  Any changes you find in each breast, including: ? The kind of changes you find. ? Whether you have pain. ? Size and location of any lumps.  When you last had your menstrual period. General tips  Check your breasts every month.  If you are breastfeeding, the best time to check your breasts is after you feed your baby or after you use a breast pump.  If you get menstrual periods, the best time to check your breasts is 5-7 days after your menstrual period is over.  With time, you will become comfortable with the self-exam, and you will begin to know if there are changes in your breasts. Contact a doctor if you:  See a change in the shape or size of your breasts or nipples.  See a change in the skin of your breast or nipples, such as red or scaly skin.  Have fluid coming from your nipples that is not normal.  Find a lump or thick area that was not there before.  Have pain in your breasts.  Have any concerns about your breast health. Summary  Breast self-awareness includes looking for changes in your breasts, as well as feeling for changes within your breasts.  Breast self-awareness should be done in front of a mirror in a well-lit room.  You should check your breasts every month. If you get menstrual periods, the best time to check your breasts is 5-7 days after your menstrual period is over.  Let your doctor know of any changes you see in your  breasts, including changes in size, changes on the skin, pain or tenderness, or fluid from your nipples that is not normal. This information is not intended to replace advice given to you by your health care provider. Make sure  you discuss any questions you have with your health care provider. Document Revised: 05/31/2018 Document Reviewed: 05/31/2018 Elsevier Patient Education  [place].

## 2021-03-11 ENCOUNTER — Encounter: Payer: Self-pay | Admitting: Obstetrics and Gynecology

## 2021-03-11 ENCOUNTER — Other Ambulatory Visit: Payer: Self-pay

## 2021-03-11 ENCOUNTER — Ambulatory Visit (INDEPENDENT_AMBULATORY_CARE_PROVIDER_SITE_OTHER): Payer: 59 | Admitting: Obstetrics and Gynecology

## 2021-03-11 VITALS — BP 123/78 | HR 73 | Ht 66.0 in | Wt 188.3 lb

## 2021-03-11 DIAGNOSIS — Z780 Asymptomatic menopausal state: Secondary | ICD-10-CM | POA: Diagnosis not present

## 2021-03-11 DIAGNOSIS — E669 Obesity, unspecified: Secondary | ICD-10-CM

## 2021-03-11 DIAGNOSIS — Z90710 Acquired absence of both cervix and uterus: Secondary | ICD-10-CM | POA: Diagnosis not present

## 2021-03-11 DIAGNOSIS — N952 Postmenopausal atrophic vaginitis: Secondary | ICD-10-CM

## 2021-03-11 DIAGNOSIS — Z01419 Encounter for gynecological examination (general) (routine) without abnormal findings: Secondary | ICD-10-CM

## 2021-03-11 DIAGNOSIS — Z1231 Encounter for screening mammogram for malignant neoplasm of breast: Secondary | ICD-10-CM | POA: Diagnosis not present

## 2021-03-11 DIAGNOSIS — M79602 Pain in left arm: Secondary | ICD-10-CM

## 2021-03-11 DIAGNOSIS — M79605 Pain in left leg: Secondary | ICD-10-CM

## 2021-03-11 NOTE — Progress Notes (Signed)
Pt present for annual exam. Pt stated that she was doing well.

## 2021-03-11 NOTE — Progress Notes (Signed)
ANNUAL PREVENTATIVE CARE GYNECOLOGY  ENCOUNTER NOTE  Subjective:       Sarah Savage is a 60 y.o. [phone] female here for a routine annual gynecologic exam. The patient is sexually active. She is not taking hormone replacement therapy. Patient denies post-menopausal vaginal bleeding. The patient wears seatbelts: yes. Has the patient ever been transfused or tattooed?: no.    Current complaints: 1.  Reports having "tummy tuck" and breast reduction last year.  Notes that she still has some work to do with her weight, noting some difficulty with weight loss. But does also note she is not as compliant with her diet. Has done Weight Watchers before which helped. Also tried Phentermine last year but notes it did not have much effect on her weight loss.     Gynecologic History No LMP recorded (lmp unknown). Patient has had a hysterectomy. Contraception: post menopausal status and patient has had a hysterectomy. Last Pap: 02/11/2016. Results were: normal. H/o abnormal pap in 2016 (ASCUS, HR HPV+, with normal colposcopy).  History of STI's: Trichomonas in 10/2017.  Last mammogram: 03/18/2020. Results were: normal Last Colonoscopy: Up to date.  Will be due in 1 year.  Last Dexa Scan: 06/27/2019. Results were: normal.    Obstetric History OB History  Gravida Para Term Preterm AB Living  4 1 1   3 1   SAB IAB Ectopic Multiple Live Births    3     1    # Outcome Date GA Lbr Len/2nd Weight Sex Delivery Anes PTL Lv  4 Term 1991   9 lb 12.8 oz (4.445 kg) M CS-LTranv   LIV  3 IAB 1990          2 IAB 1985          1 IAB             Past Medical History:  Diagnosis Date  . Arthritis   . Hyperglycemia   . Hyperlipidemia   . Hypertension   . Plantar fasciitis     Family History  Problem Relation Age of Onset  . Hypertension Mother   . Dementia Mother   . Hypertension Father   . Arthritis Father   . Stroke Brother   . Hypertension Brother   . Diabetes Brother   . Cancer Maternal  Grandmother        Breast  . Breast cancer Maternal Grandmother 66    Past Surgical History:  Procedure Laterality Date  . ABDOMINOPLASTY  2021  . BREAST REDUCTION SURGERY Bilateral 2021  . ENDOMETRIAL ABLATION    . HYSTERECTOMY ABDOMINAL WITH SALPINGO-OOPHORECTOMY Bilateral 09/12/2018   Procedure: HYSTERECTOMY ABDOMINAL WITH BILATERAL SALPINGO-OOPHORECTOMY;  Surgeon: Brayton Mars, MD;  Location: [facility] ORS;  Service: Gynecology;  Laterality: Bilateral;  . PARTIAL KNEE ARTHROPLASTY Right 06/21/2018   Procedure: RIGHT UNICOMPARTMENTAL KNEE-MEDIALLY;  Surgeon: Paralee Cancel, MD;  Location: [facility] ORS;  Service: Orthopedics;  Laterality: Right;  70 mins  . STOMACH SURGERY     excision of ulcer  . toenail removal   1990s  . UPPER GI ENDOSCOPY       Social History   Socioeconomic History  . Marital status: Married    Spouse name: Not on file  . Number of children: Not on file  . Years of education: Not on file  . Highest education level: Not on file  Occupational History  . Not on file  Tobacco Use  . Smoking status: Never Smoker  . Smokeless tobacco: Never Used  Vaping Use  . Vaping Use: Never used  Substance and Sexual Activity  . Alcohol use: Not Currently    Alcohol/week: 0.0 standard drinks    Comment: occassional  . Drug use: No  . Sexual activity: Not Currently    Birth control/protection: Surgical    Comment: hysterectomy  Other Topics Concern  . Not on file  Social History Narrative  . Not on file   Social Determinants of Health   Financial Resource Strain: Not on file  Food Insecurity: Not on file  Transportation Needs: Not on file  Physical Activity: Not on file  Stress: Not on file  Social Connections: Not on file  Intimate Partner Violence: Not on file    Current Outpatient Medications on File Prior to Visit  Medication Sig Dispense Refill  . bisoprolol-hydrochlorothiazide (ZIAC) 5-6.25 MG tablet Take 1 tablet by mouth daily.    . phentermine  (ADIPEX-P) 37.5 MG tablet Take 37.5 mg by mouth at bedtime.     No current facility-administered medications on file prior to visit.    Allergies  Allergen Reactions  . Penicillins Other (See Comments) and Rash    "I gives me a yeast infection" Has patient had a PCN reaction causing immediate rash, facial/tongue/throat swelling, SOB or lightheadedness with hypotension: No Has patient had a PCN reaction causing severe rash involving mucus membranes or skin necrosis: No Has patient had a PCN reaction that required hospitalization: No Has patient had a PCN reaction occurring within the last 10 years: No If all of the above answers are "NO", then may proceed with Cephalosporin use.  "I gives me a yeast infection"     Review of Systems ROS Review of Systems - General ROS: negative for - chills, fatigue, fever, hot flashes, night sweats, weight gain or weight loss Psychological ROS: negative for - anxiety, decreased libido, depression, mood swings, physical abuse or sexual abuse Ophthalmic ROS: negative for - blurry vision, eye pain or loss of vision ENT ROS: negative for - headaches, hearing change, visual changes or vocal changes Allergy and Immunology ROS: negative for - hives, itchy/watery eyes or seasonal allergies Hematological and Lymphatic ROS: negative for - bleeding problems, bruising, swollen lymph nodes or weight loss Endocrine ROS: negative for - galactorrhea, hair pattern changes, hot flashes, malaise/lethargy, mood swings, palpitations, polydipsia/polyuria, skin changes, temperature intolerance or unexpected weight changes Breast ROS: negative for - new or changing breast lumps or nipple discharge Respiratory ROS: negative for - cough or shortness of breath Cardiovascular ROS: negative for - chest pain, irregular heartbeat, palpitations or shortness of breath Gastrointestinal ROS: no abdominal pain, change in bowel habits, or black or bloody stools Genito-Urinary ROS: no  dysuria, trouble voiding, or hematuria Musculoskeletal ROS: Positive for left sided pain of arm and leg.  Neurological ROS: negative for - bowel and bladder control changes Dermatological ROS: negative for rash and skin lesion changes   Objective:   BP 123/78   Pulse 73   Ht 5\' 6"  (1.676 m)   Wt 188 lb 4.8 oz (85.4 kg)   LMP  (LMP Unknown)   BMI 30.39 kg/m  CONSTITUTIONAL: Well-developed, well-nourished female in no acute distress.  Mild obesity.  PSYCHIATRIC: Normal mood and affect. Normal behavior. Normal judgment and thought content. [place]: Alert and oriented to person, place, and time. Normal muscle tone coordination. No cranial nerve deficit noted. HENT:  Normocephalic, atraumatic, External right and left ear normal. Oropharynx is clear and moist EYES: Conjunctivae and EOM are normal. Pupils are  equal, round, and reactive to light. No scleral icterus.  NECK: Normal range of motion, supple, no masses.  Normal thyroid.  SKIN: Skin is warm and dry. No rash noted. Not diaphoretic. No erythema. No pallor. CARDIOVASCULAR: Normal heart rate noted, regular rhythm, no murmur. RESPIRATORY: Clear to auscultation bilaterally. Effort and breath sounds normal, no problems with respiration noted. BREASTS: Symmetric in size. No masses, skin changes, nipple drainage, or lymphadenopathy. ABDOMEN: Soft, normal bowel sounds, no distention noted.  No tenderness, rebound or guarding.  BLADDER: Normal PELVIC:  Bladder no bladder distension noted  Urethra: normal appearing urethra with no masses, tenderness or lesions  Vulva: normal appearing vulva with no masses, tenderness or lesions  Vagina: moderately atrophic vagina with no lesions or discharge  Cervix: surgically absent  Uterus: surgically absent, vaginal cuff well healed  Adnexa: surgically absent bilateral  RV: External Exam NormaI, No Rectal Masses and Normal Sphincter tone  MUSCULOSKELETAL: Normal range of motion. No tenderness.  No  cyanosis, clubbing, or edema.  2+ distal pulses. LYMPHATIC: No Axillary, Supraclavicular, or Inguinal Adenopathy.   Labs: Reviewed in Care Everywhere  Assessment:  Encounter for well woman exam with routine gynecological exam S/P hysterectomy Menopause Vaginal atrophy Obesity (BMI 30.0-34.9) Left sided body pain (leg and arm)  Plan:  Pap: Not needed. Patient is s/p hysteretomy Mammogram: Not Indicated. Up to date.  Stool Guaiac Testing:  Not Indicated. Colonoscopy up to date. Will be due next year. Labs: performed by PCP.  Routine preventative health maintenance measures emphasized: Exercise/Diet/Weight control, Tobacco Warnings, Alcohol/Substance use risks, Stress Management, Peer Pressure Issues and Safe Sex Actively discussed methods for managing weight loss with dietary modification.  Has completed COVID vaccination series, is eligible for booster.  Vaginal atrophy not bothersome to patient. No need for interventions at this time.  Menopausal, asymptomatic.  Left sided body pain being worked up by PCP. Return to [place], MD  [facility] Care

## 2021-03-25 ENCOUNTER — Ambulatory Visit: Payer: 59

## 2021-03-26 ENCOUNTER — Inpatient Hospital Stay: Admission: RE | Admit: 2021-03-26 | Payer: Self-pay | Source: Ambulatory Visit

## 2021-03-26 ENCOUNTER — Ambulatory Visit: Payer: 59

## 2021-04-01 ENCOUNTER — Other Ambulatory Visit: Payer: Self-pay

## 2021-04-01 ENCOUNTER — Ambulatory Visit
Admission: RE | Admit: 2021-04-01 | Discharge: 2021-04-01 | Disposition: A | Discharge status: Home / Self Care | Payer: 59 | Source: Ambulatory Visit | Attending: Internal Medicine | Admitting: Internal Medicine

## 2021-04-01 DIAGNOSIS — R519 Headache, unspecified: Secondary | ICD-10-CM | POA: Diagnosis present

## 2021-04-01 DIAGNOSIS — I10 Essential (primary) hypertension: Secondary | ICD-10-CM | POA: Diagnosis present

## 2021-04-01 DIAGNOSIS — R55 Syncope and collapse: Secondary | ICD-10-CM | POA: Insufficient documentation

## 2021-05-08 ENCOUNTER — Ambulatory Visit
Admission: RE | Admit: 2021-05-08 | Discharge: 2021-05-08 | Disposition: A | Discharge status: Home / Self Care | Payer: 59 | Source: Ambulatory Visit | Attending: Obstetrics and Gynecology | Admitting: Obstetrics and Gynecology

## 2021-05-08 ENCOUNTER — Other Ambulatory Visit: Payer: Self-pay

## 2021-05-08 DIAGNOSIS — Z1231 Encounter for screening mammogram for malignant neoplasm of breast: Secondary | ICD-10-CM | POA: Diagnosis present

## 2021-09-03 ENCOUNTER — Ambulatory Visit (INDEPENDENT_AMBULATORY_CARE_PROVIDER_SITE_OTHER): Payer: Self-pay | Admitting: Plastic Surgery

## 2021-09-03 ENCOUNTER — Other Ambulatory Visit: Payer: Self-pay

## 2021-09-03 VITALS — BP 118/84 | HR 76 | Ht 66.0 in | Wt 196.4 lb

## 2021-09-03 DIAGNOSIS — Z411 Encounter for cosmetic surgery: Secondary | ICD-10-CM

## 2021-09-03 NOTE — Progress Notes (Signed)
Referring Provider Idelle Crouch, MD [address],  [place] [address]   CC:  Chief Complaint  Patient presents with   Advice Only      Sarah Savage is an 60 y.o. female.  HPI: Patient presents to discuss liposuction of her abdomen and back.  She underwent breast reduction and abdominoplasty with me about a year ago.  She is overall happy with those results.  She would like a bit more flattening in her upper central abdomen and in her upper back bilaterally.  She wants to see if she is a candidate for liposuction.  No other major changes in her medical history recently.  Allergies  Allergen Reactions   Tilactase Other (See Comments)    Found out via allergy testing   Penicillins Other (See Comments) and Rash    "I gives me a yeast infection" Has patient had a PCN reaction causing immediate rash, facial/tongue/throat swelling, SOB or lightheadedness with hypotension: No Has patient had a PCN reaction causing severe rash involving mucus membranes or skin necrosis: No Has patient had a PCN reaction that required hospitalization: No Has patient had a PCN reaction occurring within the last 10 years: No If all of the above answers are "NO", then may proceed with Cephalosporin use.  "I gives me a yeast infection"    Outpatient Encounter Medications as of 09/03/2021  Medication Sig Note   bisoprolol-hydrochlorothiazide (ZIAC) 5-6.25 MG tablet Take 1 tablet by mouth daily. 09/03/2021: Pt states she is taking this medication   No facility-administered encounter medications on file as of 09/03/2021.     Past Medical History:  Diagnosis Date   Arthritis    Hyperglycemia    Hyperlipidemia    Hypertension    Plantar fasciitis     Past Surgical History:  Procedure Laterality Date   ABDOMINOPLASTY  2021   BREAST REDUCTION SURGERY Bilateral 2021   ENDOMETRIAL ABLATION     HYSTERECTOMY ABDOMINAL WITH SALPINGO-OOPHORECTOMY Bilateral  09/12/2018   Procedure: HYSTERECTOMY ABDOMINAL WITH BILATERAL SALPINGO-OOPHORECTOMY;  Surgeon: Brayton Mars, MD;  Location: [facility] ORS;  Service: Gynecology;  Laterality: Bilateral;   PARTIAL KNEE ARTHROPLASTY Right 06/21/2018   Procedure: RIGHT UNICOMPARTMENTAL KNEE-MEDIALLY;  Surgeon: Paralee Cancel, MD;  Location: [facility];  Service: Orthopedics;  Laterality: Right;  70 mins   REDUCTION MAMMAPLASTY  03/2020   STOMACH SURGERY     excision of ulcer   toenail removal   1990s   UPPER GI ENDOSCOPY      Family History  Problem Relation Age of Onset   Hypertension Mother    Dementia Mother    Hypertension Father    Arthritis Father    Stroke Brother    Hypertension Brother    Diabetes Brother    Cancer Maternal Grandmother        Breast   Breast cancer Maternal Grandmother 60    Social History   Social History Narrative   Not on file     Review of Systems General: Denies fevers, chills, weight loss CV: Denies chest pain, shortness of breath, palpitations  Physical Exam Vitals with BMI 09/03/2021 03/11/2021 10/23/2020  Height 5\' 6"  5\' 6"  -  Weight 196 lbs 6 oz 188 lbs 5 oz -  BMI 58.85 02.77 -  Systolic [phone]  Diastolic 84 78 91  Pulse 76 73 62    General:  No acute distress,  Alert and oriented, Non-Toxic, Normal speech and affect Examination of her  abdomen shows overall a good contour from the abdominoplasty.  She still does have some fullness in the supraumbilical area some of which is contributed to by intra-abdominal fullness.  In the upper back on both sides she has some excess skin and adipose tissue that I would describe is moderate.  No obvious scars on her back.  Assessment/Plan Patient presents with contour irregularities in the upper abdomen and back.  I do think she is a reasonable candidate for liposuction.  I did explain to her that liposuction alone would not fully address the skin redundancy particularly in the upper back.  I do think it would be  reasonable and would help her contour however.  We discussed risks of the procedure that include bleeding, infection, damage to surrounding structures need for additional procedures.  I discussed the approximate location and orientation of the scars.  She is can to think about this and let us know how she wants to proceed.  Cindra Presume 09/03/2021, 11:11 AM

## 2021-09-30 ENCOUNTER — Other Ambulatory Visit: Payer: Self-pay

## 2021-09-30 ENCOUNTER — Ambulatory Visit (INDEPENDENT_AMBULATORY_CARE_PROVIDER_SITE_OTHER): Payer: 59 | Admitting: Obstetrics and Gynecology

## 2021-09-30 ENCOUNTER — Encounter: Payer: Self-pay | Admitting: Obstetrics and Gynecology

## 2021-09-30 VITALS — BP 124/62 | HR 56 | Ht 66.0 in | Wt 199.7 lb

## 2021-09-30 DIAGNOSIS — I10 Essential (primary) hypertension: Secondary | ICD-10-CM | POA: Diagnosis not present

## 2021-09-30 DIAGNOSIS — E785 Hyperlipidemia, unspecified: Secondary | ICD-10-CM

## 2021-09-30 DIAGNOSIS — E669 Obesity, unspecified: Secondary | ICD-10-CM

## 2021-09-30 DIAGNOSIS — R7303 Prediabetes: Secondary | ICD-10-CM

## 2021-09-30 MED ORDER — OZEMPIC (0.25 OR 0.5 MG/DOSE) 2 MG/1.5ML ~~LOC~~ SOPN: PEN_INJECTOR | SUBCUTANEOUS | 5 refills | Status: DC

## 2021-09-30 NOTE — Patient Instructions (Signed)
Semaglutide Injection °What is this medication? °SEMAGLUTIDE (SEM a GLOO tide) treats type 2 diabetes. It works by increasing insulin levels in your body, which decreases your blood sugar (glucose). It also reduces the amount of sugar released into the blood and slows down your digestion. It can also be used to lower the risk of heart attack and stroke in people with type 2 diabetes. Changes to diet and exercise are often combined with this medication. °This medicine may be used for other purposes; ask your health care provider or pharmacist if you have questions. °COMMON BRAND NAME(S): OZEMPIC °What should I tell my care team before I take this medication? °They need to know if you have any of these conditions: °Endocrine tumors (MEN 2) or if someone in your family had these tumors °Eye disease, vision problems °History of pancreatitis °Kidney disease °Stomach problems °Thyroid cancer or if someone in your family had thyroid cancer °An unusual or allergic reaction to semaglutide, other medications, foods, dyes, or preservatives °Pregnant or trying to get pregnant °Breast-feeding °How should I use this medication? °This medication is for injection under the skin of your upper leg (thigh), stomach area, or upper arm. It is given once every week (every 7 days). You will be taught how to prepare and give this medication. Use exactly as directed. Take your medication at regular intervals. Do not take it more often than directed. °If you use this medication with insulin, you should inject this medication and the insulin separately. Do not mix them together. Do not give the injections right next to each other. Change (rotate) injection sites with each injection. °It is important that you put your used needles and syringes in a special sharps container. Do not put them in a trash can. If you do not have a sharps container, call your pharmacist or care team to get one. °A special MedGuide will be given to you by the  pharmacist with each prescription and refill. Be sure to read this information carefully each time. °This medication comes with INSTRUCTIONS FOR USE. Ask your pharmacist for directions on how to use this medication. Read the information carefully. Talk to your pharmacist or care team if you have questions. °Talk to your care team about the use of this medication in children. Special care may be needed. °Overdosage: If you think you have taken too much of this medicine contact a poison control center or emergency room at once. °NOTE: This medicine is only for you. Do not share this medicine with others. °What if I miss a dose? °If you miss a dose, take it as soon as you can within 5 days after the missed dose. Then take your next dose at your regular weekly time. If it has been longer than 5 days after the missed dose, do not take the missed dose. Take the next dose at your regular time. Do not take double or extra doses. If you have questions about a missed dose, contact your care team for advice. °What may interact with this medication? °Other medications for diabetes °Many medications may cause changes in blood sugar, these include: °Alcohol containing beverages °Antiviral medications for HIV or AIDS °Aspirin and aspirin-like medications °Certain medications for blood pressure, heart disease, irregular heart beat °Chromium °Diuretics °Female hormones, such as estrogens or progestins, birth control pills °Fenofibrate °Gemfibrozil °Isoniazid °Lanreotide °Female hormones or anabolic steroids °MAOIs like Carbex, Eldepryl, Marplan, Nardil, and Parnate °Medications for weight loss °Medications for allergies, asthma, cold, or cough °Medications for depression,   anxiety, or psychotic disturbances °Niacin °Nicotine °NSAIDs, medications for pain and inflammation, like ibuprofen or naproxen °Octreotide °Pasireotide °Pentamidine °Phenytoin °Probenecid °Quinolone antibiotics such as ciprofloxacin, levofloxacin, ofloxacin °Some  herbal dietary supplements °Steroid medications such as prednisone or cortisone °Sulfamethoxazole; trimethoprim °Thyroid hormones °Some medications can hide the warning symptoms of low blood sugar (hypoglycemia). You may need to monitor your blood sugar more closely if you are taking one of these medications. These include: °Beta-blockers, often used for high blood pressure or heart problems (examples include atenolol, metoprolol, propranolol) °Clonidine °Guanethidine °Reserpine °This list may not describe all possible interactions. Give your health care provider a list of all the medicines, herbs, non-prescription drugs, or dietary supplements you use. Also tell them if you smoke, drink alcohol, or use illegal drugs. Some items may interact with your medicine. °What should I watch for while using this medication? °Visit your care team for regular checks on your progress. °Drink plenty of fluids while taking this medication. Check with your care team if you get an attack of severe diarrhea, nausea, and vomiting. The loss of too much body fluid can make it dangerous for you to take this medication. °A test called the HbA1C (A1C) will be monitored. This is a simple blood test. It measures your blood sugar control over the last 2 to 3 months. You will receive this test every 3 to 6 months. °Learn how to check your blood sugar. Learn the symptoms of low and high blood sugar and how to manage them. °Always carry a quick-source of sugar with you in case you have symptoms of low blood sugar. Examples include hard sugar candy or glucose tablets. Make sure others know that you can choke if you eat or drink when you develop serious symptoms of low blood sugar, such as seizures or unconsciousness. They must get medical help at once. °Tell your care team if you have high blood sugar. You might need to change the dose of your medication. If you are sick or exercising more than usual, you might need to change the dose of your  medication. °Do not skip meals. Ask your care team if you should avoid alcohol. Many nonprescription cough and cold products contain sugar or alcohol. These can affect blood sugar. °Pens should never be shared. Even if the needle is changed, sharing may result in passing of viruses like hepatitis or HIV. °Wear a medical ID bracelet or chain, and carry a card that describes your disease and details of your medication and dosage times. °Do not become pregnant while taking this medication. Women should inform their care team if they wish to become pregnant or think they might be pregnant. There is a potential for serious side effects to an unborn child. Talk to your care team for more information. °What side effects may I notice from receiving this medication? °Side effects that you should report to your care team as soon as possible: °Allergic reactions--skin rash, itching, hives, swelling of the face, lips, tongue, or throat °Change in vision °Dehydration--increased thirst, dry mouth, feeling faint or lightheaded, headache, dark yellow or brown urine °Gallbladder problems--severe stomach pain, nausea, vomiting, fever °Heart palpitations--rapid, pounding, or irregular heartbeat °Kidney injury--decrease in the amount of urine, swelling of the ankles, hands, or feet °Pancreatitis--severe stomach pain that spreads to your back or gets worse after eating or when touched, fever, nausea, vomiting °Thyroid cancer--new mass or lump in the neck, pain or trouble swallowing, trouble breathing, hoarseness °Side effects that usually do not require medical   attention (report to your care team if they continue or are bothersome): °Diarrhea °Loss of appetite °Nausea °Stomach pain °Vomiting °This list may not describe all possible side effects. Call your doctor for medical advice about side effects. You may report side effects to FDA at 1-800-FDA-1088. °Where should I keep my medication? °Keep out of the reach of children. °Store  unopened pens in a refrigerator between 2 and 8 degrees C (36 and 46 degrees F). Do not freeze. Protect from light and heat. After you first use the pen, it can be stored for 56 days at room temperature between 15 and 30 degrees C (59 and 86 degrees F) or in a refrigerator. Throw away your used pen after 56 days or after the expiration date, whichever comes first. °Do not store your pen with the needle attached. If the needle is left on, medication may leak from the pen. °NOTE: This sheet is a summary. It may not cover all possible information. If you have questions about this medicine, talk to your doctor, pharmacist, or health care provider. °© 2022 [employer]vier/Gold Standard (2021-01-16 00:00:00) ° °

## 2021-09-30 NOTE — Progress Notes (Signed)
    GYNECOLOGY PROGRESS NOTE  Subjective:    Patient ID: Sarah Savage, female    DOB: [birth year], 60 y.o.   MRN: [id number]  HPI  Patient is a 60 y.o. G11P1031 female who presents for discussion of liposuction.  Has had h/o "tummy tuck" in the past and has had breast reduction.  Notes that she is drinking weight loss supplements (Drink to Shrink), limiting carbs.  Not exercising as much as she used to.  Has tried several weight loss efforts in the past (exercise with trainer, dietary modification, prescription meds including Adipex). Notes the only thing that used to work for her was [employer].   Would like to be able to lose some weight prior to procedure.  The following portions of the patient's history were reviewed and updated as appropriate: allergies, current medications, past family history, past medical history, past social history, past surgical history, and problem list.  Review of Systems Pertinent items noted in HPI and remainder of comprehensive ROS otherwise negative.   Objective:   Blood pressure 124/62, pulse (!) 56, height 5\' 6"  (1.676 m), weight 199 lb 11.2 oz (90.6 kg). Body mass index is 32.23 kg/m. General appearance: alert and no distress Remainder of exam deferred.    Labs reviewed in [place].  Assessment:   1. Obesity (BMI 30.0-34.9)   2. Prediabetes   3. Dyslipidemia   4. Essential hypertension      Plan:   Encouraged patient to resume use of Weight Watchers program  Discussed other weight loss management options in light of patient's comorbidities, including The risks and benefits and side effects of medication, such as Belviq (lorcarsin), Contrave (buproprion/naltrexone), Qsymia (phentermine/topiramate), and Saxenda (liraglutide) were discussed. Also discussed other diabetic medications know to exhibit weight reduction such as Wegovy, Mounjaro, and Ozempic. The pros and cons of suppressing appetite and boosting metabolism is discussed.  Risks of tolerence and addiction is discussed for certain agents discussed. After discussion, patient ok to start [place].  Discussed titration of dosing. Will prescribe. To f/u in 3 months for reassessment.   Patient notes that she plans to proceed with liposuction, but will re-evaluate if she begins to note significant weight loss.   A total of 20 minutes were spent face-to-face with the patient during this encounter and over half of that time involved counseling and coordination of care.    Rubie Maid, MD [facility]

## 2021-10-01 ENCOUNTER — Telehealth: Payer: Self-pay | Admitting: Obstetrics and Gynecology

## 2021-10-01 NOTE — Telephone Encounter (Signed)
Pt called stating that her pharmacy does not have the RX sent- they are asking for an alternative. Please Advise.

## 2021-10-06 NOTE — Telephone Encounter (Signed)
Called pharmacy. Patient has picked up her Ozempic.

## 2021-10-06 NOTE — Telephone Encounter (Signed)
Pt is calling to see what the call was in reference to.  Pt is aware of the below msg and stated that she picked the medication up from [facility] and have a 1 months supply.  Pt stated that should would call to see if any of the other pharmacies has Rx Ozempic in stock and give [place] a call back to let us know which one she would like to go with.

## 2021-10-09 ENCOUNTER — Telehealth: Payer: Self-pay | Admitting: Obstetrics and Gynecology

## 2021-10-09 NOTE — Telephone Encounter (Signed)
Ok.  Please have her scheduled to f//u with me in 3 months for a weight check.

## 2021-10-09 NOTE — Telephone Encounter (Signed)
FYI:  Pt is calling in stating that she was instructed to call the office to let Dr. Marcelline Mates know when she started taking Rx Ozempic and it was on 10/07/2021.  Pt stated that it is okay so far and if she should have any concerns she will call the office to let us know.

## 2021-10-10 NOTE — Telephone Encounter (Signed)
Pt has been scheduled for 12/31/2021 at 1:30 p.m.

## 2021-11-11 NOTE — Progress Notes (Signed)
Patient ID: Sarah Savage, female    DOB: 23-Jan-1961, 61 y.o.   MRN: [id number]  Chief Complaint  Patient presents with   Pre-op Exam      ICD-10-CM   1. Encounter for cosmetic procedure  Z41.1        History of Present Illness: Sarah Savage is a 61 y.o.  female  with a history of previous breast reduction and abdominoplasty.  She presents for preoperative evaluation for upcoming procedure, liposuction of abdomen and back, scheduled for 12/08/2021 with Dr. Claudia Desanctis.  The patient has not had problems with anesthesia.  Patient is on semaglutide once per week for prediabetes, but she states that she has never been formally diagnosed with diabetes.  She takes bisoprolol hydrochlorothiazide for blood pressure which is well controlled.  She does not smoke.  She is not on blood thinners.  Denies personal or family history of blood clots or clotting disorder.  She has never had cancer.  PCN rash but otherwise NKDA, no tape allergies.  She denies any recent illness or infection.  Patient clarifies that she does not want liposuction or contouring laterally over upper back, but rather in flank region bilaterally in addition to the abdomen.  Summary of Previous Visit: She was seen for consult 09/03/2021.  Examination demonstrated good contour from previous abdominoplasty.  Excess skin and adipose tissue was appreciated laterally in the area of upper back.  There are also multiple areas of fullness appreciated in abdomen.  Discussed options for surgical intervention and patient expressed that she would like to proceed.  Job: [employer] center.  PMH Significant for: History of breast reduction, abdominoplasty, C-section.   Past Medical History: Allergies: Allergies  Allergen Reactions   Tilactase Other (See Comments)    Found out via allergy testing   Penicillins Other (See Comments) and Rash    "I gives me a yeast infection" Has patient had a PCN reaction causing immediate rash,  facial/tongue/throat swelling, SOB or lightheadedness with hypotension: No Has patient had a PCN reaction causing severe rash involving mucus membranes or skin necrosis: No Has patient had a PCN reaction that required hospitalization: No Has patient had a PCN reaction occurring within the last 10 years: No If all of the above answers are "NO", then may proceed with Cephalosporin use.  "I gives me a yeast infection"    Current Medications:  Current Outpatient Medications:    Semaglutide,0.25 or 0.5MG /DOS, (OZEMPIC, 0.25 OR 0.5 MG/DOSE,) 2 MG/1.5ML SOPN, Inject 0.25 mg into the skin once a week for 30 days, THEN 0.5 mg once a week., Disp: 1.5 mL, Rfl: 5   bisoprolol-hydrochlorothiazide (ZIAC) 5-6.25 MG tablet, Take 1 tablet by mouth daily., Disp: , Rfl:   Past Medical Problems: Past Medical History:  Diagnosis Date   Arthritis    Hyperglycemia    Hyperlipidemia    Hypertension    Plantar fasciitis     Past Surgical History: Past Surgical History:  Procedure Laterality Date   ABDOMINOPLASTY  2021   BREAST REDUCTION SURGERY Bilateral 2021   ENDOMETRIAL ABLATION     HYSTERECTOMY ABDOMINAL WITH SALPINGO-OOPHORECTOMY Bilateral 09/12/2018   Procedure: HYSTERECTOMY ABDOMINAL WITH BILATERAL SALPINGO-OOPHORECTOMY;  Surgeon: Brayton Mars, MD;  Location: [facility] ORS;  Service: Gynecology;  Laterality: Bilateral;   PARTIAL KNEE ARTHROPLASTY Right 06/21/2018   Procedure: RIGHT UNICOMPARTMENTAL KNEE-MEDIALLY;  Surgeon: Paralee Cancel, MD;  Location: [facility];  Service: Orthopedics;  Laterality: Right;  70 mins   REDUCTION MAMMAPLASTY  03/2020  STOMACH SURGERY     excision of ulcer   toenail removal   1990s   UPPER GI ENDOSCOPY      Social History: Social History   Socioeconomic History   Marital status: Married    Spouse name: Not on file   Number of children: Not on file   Years of education: Not on file   Highest education level: Not on file  Occupational History   Not on  file  Tobacco Use   Smoking status: Never   Smokeless tobacco: Never  Vaping Use   Vaping Use: Never used  Substance and Sexual Activity   Alcohol use: Not Currently    Alcohol/week: 0.0 standard drinks    Comment: occassional   Drug use: No   Sexual activity: Not Currently    Birth control/protection: Surgical    Comment: hysterectomy  Other Topics Concern   Not on file  Social History Narrative   Not on file   Social Determinants of Health   Financial Resource Strain: Not on file  Food Insecurity: Not on file  Transportation Needs: Not on file  Physical Activity: Not on file  Stress: Not on file  Social Connections: Not on file  Intimate Partner Violence: Not on file    Family History: Family History  Problem Relation Age of Onset   Hypertension Mother    Dementia Mother    Hypertension Father    Arthritis Father    Stroke Brother    Hypertension Brother    Diabetes Brother    Cancer Maternal Grandmother        Breast   Breast cancer Maternal Grandmother 60    Review of Systems: ROS Denies recent illness or infection, fevers chills, chest pain or shortness of breath.  Physical Exam: Vital Signs BP 124/88 (BP Location: Left Arm, Patient Position: Sitting, Cuff Size: Large)    Pulse 65    Ht 5\' 6"  (1.676 m)    Wt 196 lb 9.6 oz (89.2 kg)    LMP  (LMP Unknown)    SpO2 100%    BMI 31.73 kg/m   Physical Exam Constitutional:      General: Not in acute distress.    Appearance: Normal appearance. Not ill-appearing.  HENT:     Head: Normocephalic and atraumatic.  Eyes:     Pupils: Pupils are equal, round. Cardiovascular:     Rate and Rhythm: Normal rate.    Pulses: Normal pulses.  Pulmonary:     Effort: No respiratory distress or increased work of breathing.  Speaks in full sentences. Abdominal:     General: Abdomen is flat. No distension.   Musculoskeletal: Normal range of motion. No lower extremity swelling or edema. No varicosities. Skin:    General:  Skin is warm and dry.     Findings: No erythema or rash.  Neurological:     Mental Status: Alert and oriented to person, place, and time.  Psychiatric:        Mood and Affect: Mood normal.        Behavior: Behavior normal.    Assessment/Plan: The patient is scheduled for 12/08/2021 with Dr. Claudia Desanctis.  Risks, benefits, and alternatives of procedure discussed, questions answered and consent obtained.    Smoking Status: Non-smoker. Last Mammogram: 04/2021; Results: BI-RADS Category 1: Negative.  Caprini Score: 4; Risk Factors include: Age, BMI greater than 25, and length of planned surgery. Recommendation for mechanical prophylaxis. Encourage early ambulation.   Pictures obtained: Today.  Post-op Rx sent  to pharmacy: Norco, Zofran.  Patient was provided with the General Surgical Risk consent document and Pain Medication Agreement prior to their appointment.  They had adequate time to read through the risk consent documents and Pain Medication Agreement. We also discussed them in person together during this preop appointment. All of their questions were answered to their satisfaction.  Recommended calling if they have any further questions.  Risk consent form and Pain Medication Agreement to be scanned into patient's chart.  The risks that can be encountered with and after liposuction were discussed and include the following but no limited to these:  Asymmetry, fluid accumulation, firmness of the area, fat necrosis with death of fat tissue, bleeding, infection, delayed healing, anesthesia risks, skin sensation changes, injury to structures including nerves, blood vessels, and muscles which may be temporary or permanent, allergies to tape, suture materials and glues, blood products, topical preparations or injected agents, skin and contour irregularities, skin discoloration and swelling, deep vein thrombosis, cardiac and pulmonary complications, pain, which may persist, persistent pain, recurrence of  the lesion, poor healing of the incision, possible need for revisional surgery or staged procedures. Thiere can also be persistent swelling, poor wound healing, rippling or loose skin, worsening of cellulite, swelling, and thermal burn or heat injury from ultrasound with the ultrasound-assisted lipoplasty technique. Any change in weight fluctuations can alter the outcome.    Electronically signed by: Krista Blue, PA-C 11/12/2021 1:51 PM

## 2021-11-12 ENCOUNTER — Other Ambulatory Visit: Payer: Self-pay

## 2021-11-12 ENCOUNTER — Encounter: Payer: Self-pay | Admitting: Physician Assistant

## 2021-11-12 ENCOUNTER — Ambulatory Visit (INDEPENDENT_AMBULATORY_CARE_PROVIDER_SITE_OTHER): Payer: Self-pay | Admitting: Physician Assistant

## 2021-11-12 VITALS — BP 124/88 | HR 65 | Ht 66.0 in | Wt 196.6 lb

## 2021-11-12 DIAGNOSIS — Z411 Encounter for cosmetic surgery: Secondary | ICD-10-CM

## 2021-11-12 MED ORDER — HYDROCODONE-ACETAMINOPHEN 5-325 MG PO TABS: 1.0000 | ORAL_TABLET | Freq: Four times a day (QID) | ORAL | 0 refills | Status: AC | PRN

## 2021-11-12 MED ORDER — ONDANSETRON 4 MG PO TBDP: 4.0000 mg | ORAL_TABLET | Freq: Three times a day (TID) | ORAL | 0 refills | Status: DC | PRN

## 2021-11-18 ENCOUNTER — Telehealth: Payer: Self-pay | Admitting: Obstetrics and Gynecology

## 2021-11-18 NOTE — Telephone Encounter (Signed)
Called patient. She is no longer on Ozempic. Her PCP took her off and put her on something else.

## 2021-11-18 NOTE — Telephone Encounter (Signed)
Pt called stating that she is out ozempic, she tried to get from pharmacy but now has a new insurance (has been updated in chart), confirmed pharmacy as [facility] in [place]. Please Advise.

## 2021-12-17 ENCOUNTER — Encounter: Payer: 59 | Admitting: Plastic Surgery

## 2021-12-25 NOTE — Progress Notes (Signed)
?  Patient presents for weight management visit. Notes that she was unable to continue Ozempic due to insurance. Also complains of left breast pain for 3 months.  ? ? ?Carey Lafon L, CMA ?[facility] ?

## 2021-12-26 ENCOUNTER — Encounter: Payer: Self-pay | Admitting: Obstetrics and Gynecology

## 2021-12-26 ENCOUNTER — Ambulatory Visit: Payer: Managed Care, Other (non HMO) | Admitting: Obstetrics and Gynecology

## 2021-12-26 ENCOUNTER — Other Ambulatory Visit: Payer: Self-pay

## 2021-12-26 VITALS — BP 132/76 | HR 64 | Resp 16 | Ht 66.0 in | Wt 198.1 lb

## 2021-12-26 DIAGNOSIS — N644 Mastodynia: Secondary | ICD-10-CM

## 2021-12-26 DIAGNOSIS — R7303 Prediabetes: Secondary | ICD-10-CM

## 2021-12-26 DIAGNOSIS — E785 Hyperlipidemia, unspecified: Secondary | ICD-10-CM | POA: Diagnosis not present

## 2021-12-26 DIAGNOSIS — I10 Essential (primary) hypertension: Secondary | ICD-10-CM | POA: Diagnosis not present

## 2021-12-26 DIAGNOSIS — E669 Obesity, unspecified: Secondary | ICD-10-CM

## 2021-12-26 NOTE — Progress Notes (Signed)
? ? ?  GYNECOLOGY PROGRESS NOTE ? ?Subjective:  ? ? Patient ID: Sarah Savage, female    DOB: [birth year], 61 y.o.   MRN: [id number] ? ?HPI ? Patient is a 60 y.o. female who presents for 3 month weight management follow up. She has a past history of obesity, prediabetes, dyslipidemia and HTN. She initiated use of Ozempic  3 months ago, but only used for 1 month as she changed insurance plans and now they will no longer cover.  Denies any undesirable side effects and reports compliance with medications.  ?  ?Current interventions:  ?1. Diet - monitoring her diet.  Also began Weight Watchers last week.  ?2. Activity - Trying to get back into the gym, going 3 days per week, cardio for ~ 45 min.  ?3. Reports bowel movements are Normal.  ? ? ?Patient also complains of breast pain for the past 2-3 months.  Pain is in the left breast. Sometimes thinks she feels a knot. Last mammogram was in 05/08/2021, was normal. No prior history of breast issues.  ? ? ?The following portions of the patient's history were reviewed and updated as appropriate: allergies, current medications, past family history, past medical history, past social history, past surgical history, and problem list. ? ?Review of Systems ?Pertinent items noted in HPI and remainder of comprehensive ROS otherwise negative.  ? ?Objective:  ?  ?Vitals with BMI 12/26/2021 11/12/2021 09/30/2021  ?Height 5\' 6"  5\' 6"  5\' 6"   ?Weight 198 lbs 2 oz 196 lbs 10 oz 199 lbs 11 oz  ?BMI 31.99 31.75 32.25  ?Systolic [phone]  ?Diastolic 76 88 62  ?Pulse 64 65 56  ? ? ?General appearance: alert, cooperative, and no distress ?Breast: Breasts: breasts appear normal, no suspicious masses, no skin or nipple changes or axillary nodes.  Fatty tissue pad thickened on left axillary region. ?Abdomen: soft, non-tender.  Waist circumference 41 in.  ? ? ?Labs:  ?No new labs ? ?Assessment:  ? ?Weight management ?Obesity, Body mass index is 31.97 kg/m?Marland Kitchen ?Prediabetes ?HTN ?Dyslipidemia ?Breast  pain ? ?Plan:  ? ?Weight management  - patient working on lifestyle and dietary changes.  Unable to continue Ozempic due to insurance.  Discussed alternative medication options. Patient to contact insurance to see which may be covered.  ?Prediabetes - will continue to work on dietary changes/lifestyle changes. Depending on medication coverage, can also help with this as well.  ?HTN well managed on meds.  ?Dyslipidemia - to be managed with lifestyle/diet.  ?Breast pain - discussed breast pain causes. Encouraged limiting caffeine, sugar intake. Can take NSAIDs, evening primrose oil, and ice packs. If no improvement, consider breast ultrasound. Advised on well-fitting sports bras during exercise.  ? ? ?Rubie Maid, MD ?[facility] ?  ?

## 2021-12-31 ENCOUNTER — Encounter: Payer: 59 | Admitting: Obstetrics and Gynecology

## 2021-12-31 ENCOUNTER — Encounter: Payer: 59 | Admitting: Physician Assistant

## 2022-01-06 ENCOUNTER — Telehealth: Payer: Self-pay | Admitting: Obstetrics and Gynecology

## 2022-01-06 NOTE — Telephone Encounter (Signed)
Made in error

## 2022-01-19 ENCOUNTER — Other Ambulatory Visit: Payer: Self-pay | Admitting: Obstetrics and Gynecology

## 2022-01-19 MED ORDER — SEMAGLUTIDE-WEIGHT MANAGEMENT 0.5 MG/0.5ML ~~LOC~~ SOAJ: 0.5000 mg | SUBCUTANEOUS | 0 refills | Status: DC

## 2022-01-19 MED ORDER — SEMAGLUTIDE-WEIGHT MANAGEMENT 2.4 MG/0.75ML ~~LOC~~ SOAJ: 2.4000 mg | SUBCUTANEOUS | 6 refills | Status: DC

## 2022-01-19 MED ORDER — SEMAGLUTIDE-WEIGHT MANAGEMENT 1 MG/0.5ML ~~LOC~~ SOAJ: 1.0000 mg | SUBCUTANEOUS | 0 refills | Status: AC

## 2022-01-19 MED ORDER — SEMAGLUTIDE-WEIGHT MANAGEMENT 1 MG/0.5ML ~~LOC~~ SOAJ: 1.0000 mg | SUBCUTANEOUS | 0 refills | Status: DC

## 2022-01-19 MED ORDER — SEMAGLUTIDE-WEIGHT MANAGEMENT 2.4 MG/0.75ML ~~LOC~~ SOAJ: 2.4000 mg | SUBCUTANEOUS | 6 refills | Status: AC

## 2022-01-19 MED ORDER — SEMAGLUTIDE-WEIGHT MANAGEMENT 0.25 MG/0.5ML ~~LOC~~ SOAJ: 0.2500 mg | SUBCUTANEOUS | 0 refills | Status: AC

## 2022-01-19 MED ORDER — SEMAGLUTIDE-WEIGHT MANAGEMENT 1.7 MG/0.75ML ~~LOC~~ SOAJ: 1.7000 mg | SUBCUTANEOUS | 0 refills | Status: AC

## 2022-01-19 MED ORDER — SEMAGLUTIDE-WEIGHT MANAGEMENT 0.25 MG/0.5ML ~~LOC~~ SOAJ: 0.2500 mg | SUBCUTANEOUS | 0 refills | Status: DC

## 2022-01-19 MED ORDER — SEMAGLUTIDE-WEIGHT MANAGEMENT 0.5 MG/0.5ML ~~LOC~~ SOAJ: 0.5000 mg | SUBCUTANEOUS | 0 refills | Status: AC

## 2022-01-19 MED ORDER — SEMAGLUTIDE-WEIGHT MANAGEMENT 1.7 MG/0.75ML ~~LOC~~ SOAJ: 1.7000 mg | SUBCUTANEOUS | 0 refills | Status: DC

## 2022-01-19 NOTE — Progress Notes (Unsigned)
Please inform patient that prescription for Mancel Parsons has been sent in.  ? ?Dr. Marcelline Mates ?

## 2022-03-11 ENCOUNTER — Other Ambulatory Visit: Payer: Self-pay | Admitting: Internal Medicine

## 2022-03-11 DIAGNOSIS — Z1231 Encounter for screening mammogram for malignant neoplasm of breast: Secondary | ICD-10-CM

## 2022-05-11 ENCOUNTER — Ambulatory Visit
Admission: RE | Admit: 2022-05-11 | Discharge: 2022-05-11 | Disposition: A | Discharge status: Home / Self Care | Payer: No Typology Code available for payment source | Source: Ambulatory Visit | Attending: Internal Medicine | Admitting: Internal Medicine

## 2022-05-11 DIAGNOSIS — Z1231 Encounter for screening mammogram for malignant neoplasm of breast: Secondary | ICD-10-CM | POA: Diagnosis present

## 2022-11-25 NOTE — Progress Notes (Unsigned)
ANNUAL PREVENTATIVE CARE GYNECOLOGY  ENCOUNTER NOTE  Subjective:       Sarah Savage is a 62 y.o. [phone] female here for a routine annual gynecologic exam. The patient is not sexually active. The patient is not taking hormone replacement therapy. Patient denies post-menopausal vaginal bleeding. The patient wears seatbelts: yes. The patient participates in regular exercise: no. Has the patient ever been transfused or tattooed?: no. The patient reports that there is not domestic violence in her life.  Current complaints: 1.  ***    Gynecologic History No LMP recorded (lmp unknown). Patient has had a hysterectomy. Contraception: post menopausal status and patient has had a hysterectomy. Last Pap: 02/11/2016. Results were: normal. H/o abnormal pap in 2016 (ASCUS, HR HPV+, with normal colposcopy).  History of STI's: Trichomonas in 10/2017.  Last mammogram: 05/11/2022. Results were: normal Last Colonoscopy: DUE Last Dexa Scan: 06/27/2019. Results were: normal.   Obstetric History OB History  Gravida Para Term Preterm AB Living  '4 1 1   3 1  '$ SAB IAB Ectopic Multiple Live Births    3     1    # Outcome Date GA Lbr Len/2nd Weight Sex Delivery Anes PTL Lv  4 Term 1991   9 lb 12.8 oz (4.445 kg) M CS-LTranv   LIV  3 IAB 1990          2 IAB 1985          1 IAB             Past Medical History:  Diagnosis Date   Arthritis    Hyperglycemia    Hyperlipidemia    Hypertension    Plantar fasciitis     Family History  Problem Relation Age of Onset   Hypertension Mother    Dementia Mother    Hypertension Father    Arthritis Father    Stroke Brother    Hypertension Brother    Diabetes Brother    Cancer Maternal Grandmother        Breast   Breast cancer Maternal Grandmother 60    Past Surgical History:  Procedure Laterality Date   ABDOMINOPLASTY  2021   BREAST REDUCTION SURGERY Bilateral 2021   ENDOMETRIAL ABLATION     HYSTERECTOMY ABDOMINAL WITH SALPINGO-OOPHORECTOMY  Bilateral 09/12/2018   Procedure: HYSTERECTOMY ABDOMINAL WITH BILATERAL SALPINGO-OOPHORECTOMY;  Surgeon: Brayton Mars, MD;  Location: [facility] ORS;  Service: Gynecology;  Laterality: Bilateral;   PARTIAL KNEE ARTHROPLASTY Right 06/21/2018   Procedure: RIGHT UNICOMPARTMENTAL KNEE-MEDIALLY;  Surgeon: Paralee Cancel, MD;  Location: [facility];  Service: Orthopedics;  Laterality: Right;  70 mins   REDUCTION MAMMAPLASTY  03/2020   STOMACH SURGERY     excision of ulcer   toenail removal   1990s   UPPER GI ENDOSCOPY      Social History   Socioeconomic History   Marital status: Married    Spouse name: Not on file   Number of children: Not on file   Years of education: Not on file   Highest education level: Not on file  Occupational History   Not on file  Tobacco Use   Smoking status: Never   Smokeless tobacco: Never  Vaping Use   Vaping Use: Never used  Substance and Sexual Activity   Alcohol use: Not Currently    Alcohol/week: 0.0 standard drinks of alcohol    Comment: occassional   Drug use: No   Sexual activity: Not Currently    Birth control/protection: Surgical  Comment: hysterectomy  Other Topics Concern   Not on file  Social History Narrative   Not on file   Social Determinants of Health   Financial Resource Strain: Not on file  Food Insecurity: Not on file  Transportation Needs: Not on file  Physical Activity: Inactive (03/16/2018)   Exercise Vital Sign    Days of Exercise per Week: 0 days    Minutes of Exercise per Session: 0 min  Stress: Not on file  Social Connections: Not on file  Intimate Partner Violence: Not on file    Current Outpatient Medications on File Prior to Visit  Medication Sig Dispense Refill   bisoprolol-hydrochlorothiazide (ZIAC) 5-6.25 MG tablet Take 1 tablet by mouth daily.     ondansetron (ZOFRAN-ODT) 4 MG disintegrating tablet Take 1 tablet (4 mg total) by mouth every 8 (eight) hours as needed for nausea or vomiting. 20 tablet 0    Semaglutide,0.25 or 0.'5MG'$ /DOS, 2 MG/1.5ML SOPN Inject into the skin.     No current facility-administered medications on file prior to visit.    Allergies  Allergen Reactions   Tilactase Other (See Comments)    Found out via allergy testing   Penicillins Other (See Comments) and Rash    "I gives me a yeast infection" Has patient had a PCN reaction causing immediate rash, facial/tongue/throat swelling, SOB or lightheadedness with hypotension: No Has patient had a PCN reaction causing severe rash involving mucus membranes or skin necrosis: No Has patient had a PCN reaction that required hospitalization: No Has patient had a PCN reaction occurring within the last 10 years: No If all of the above answers are "NO", then may proceed with Cephalosporin use.  "I gives me a yeast infection"      Review of Systems ROS Review of Systems - General ROS: negative for - chills, fatigue, fever, hot flashes, night sweats, weight gain or weight loss Psychological ROS: negative for - anxiety, decreased libido, depression, mood swings, physical abuse or sexual abuse Ophthalmic ROS: negative for - blurry vision, eye pain or loss of vision ENT ROS: negative for - headaches, hearing change, visual changes or vocal changes Allergy and Immunology ROS: negative for - hives, itchy/watery eyes or seasonal allergies Hematological and Lymphatic ROS: negative for - bleeding problems, bruising, swollen lymph nodes or weight loss Endocrine ROS: negative for - galactorrhea, hair pattern changes, hot flashes, malaise/lethargy, mood swings, palpitations, polydipsia/polyuria, skin changes, temperature intolerance or unexpected weight changes Breast ROS: negative for - new or changing breast lumps or nipple discharge Respiratory ROS: negative for - cough or shortness of breath Cardiovascular ROS: negative for - chest pain, irregular heartbeat, palpitations or shortness of breath Gastrointestinal ROS: no abdominal pain,  change in bowel habits, or black or bloody stools Genito-Urinary ROS: no dysuria, trouble voiding, or hematuria Musculoskeletal ROS: negative for - joint pain or joint stiffness Neurological ROS: negative for - bowel and bladder control changes Dermatological ROS: negative for rash and skin lesion changes   Objective:   LMP  (LMP Unknown)  CONSTITUTIONAL: Well-developed, well-nourished female in no acute distress.  PSYCHIATRIC: Normal mood and affect. Normal behavior. Normal judgment and thought content. [place]: Alert and oriented to person, place, and time. Normal muscle tone coordination. No cranial nerve deficit noted. HENT:  Normocephalic, atraumatic, External right and left ear normal. Oropharynx is clear and moist EYES: Conjunctivae and EOM are normal. Pupils are equal, round, and reactive to light. No scleral icterus.  NECK: Normal range of motion, supple, no masses.  Normal thyroid.  SKIN: Skin is warm and dry. No rash noted. Not diaphoretic. No erythema. No pallor. CARDIOVASCULAR: Normal heart rate noted, regular rhythm, no murmur. RESPIRATORY: Clear to auscultation bilaterally. Effort and breath sounds normal, no problems with respiration noted. BREASTS: Symmetric in size. No masses, skin changes, nipple drainage, or lymphadenopathy. ABDOMEN: Soft, normal bowel sounds, no distention noted.  No tenderness, rebound or guarding.  BLADDER: Normal PELVIC:  Bladder {:311640}  Urethra: {:311719}  Vulva: {:311722}  Vagina: {:311643}  Cervix: {:311644}  Uterus: {:311718}  Adnexa: {:311645}  RV: {Blank multiple:19196::"External Exam NormaI","No Rectal Masses","Normal Sphincter tone"}  MUSCULOSKELETAL: Normal range of motion. No tenderness.  No cyanosis, clubbing, or edema.  2+ distal pulses. LYMPHATIC: No Axillary, Supraclavicular, or Inguinal Adenopathy.   Labs: Lab Results  Component Value Date   WBC 7.5 09/06/2018   HGB 11.6 (L) 09/13/2018   HCT 40.8 09/06/2018   MCV  83.6 09/06/2018   PLT 408 (H) 09/06/2018    Lab Results  Component Value Date   CREATININE 0.63 09/06/2018   BUN 16 09/06/2018   NA 141 09/06/2018   K 3.7 09/06/2018   CL 105 09/06/2018   CO2 26 09/06/2018    No results found for: "ALT", "AST", "GGT", "ALKPHOS", "BILITOT"  No results found for: "CHOL", "HDL", "LDLCALC", "LDLDIRECT", "TRIG", "CHOLHDL"  No results found for: "TSH"  No results found for: "HGBA1C"   Assessment:   1. Encounter for well woman exam with routine gynecological exam   2. Encounter for screening mammogram for malignant neoplasm of breast   3. Prediabetes   4. Dyslipidemia   5. Essential hypertension   6. Obesity (BMI 30.0-34.9)      Plan:  Pap: Not needed Mammogram: Ordered Colon Screening:   UTD due again in April Labs:  Done by PCP Routine preventative health maintenance measures emphasized: Exercise/Diet/Weight control and Stress Management, Self Breast Exams COVID Vaccination status: Return to [place], [place]

## 2022-11-27 ENCOUNTER — Ambulatory Visit (INDEPENDENT_AMBULATORY_CARE_PROVIDER_SITE_OTHER): Payer: No Typology Code available for payment source | Admitting: Obstetrics and Gynecology

## 2022-11-27 ENCOUNTER — Encounter: Payer: Self-pay | Admitting: Obstetrics and Gynecology

## 2022-11-27 VITALS — BP 121/80 | HR 62 | Resp 16 | Ht 65.5 in | Wt 195.8 lb

## 2022-11-27 DIAGNOSIS — Z01419 Encounter for gynecological examination (general) (routine) without abnormal findings: Secondary | ICD-10-CM | POA: Diagnosis not present

## 2022-11-27 DIAGNOSIS — N6311 Unspecified lump in the right breast, upper outer quadrant: Secondary | ICD-10-CM | POA: Diagnosis not present

## 2022-11-27 DIAGNOSIS — E785 Hyperlipidemia, unspecified: Secondary | ICD-10-CM | POA: Diagnosis not present

## 2022-11-27 DIAGNOSIS — Z1231 Encounter for screening mammogram for malignant neoplasm of breast: Secondary | ICD-10-CM

## 2022-11-27 DIAGNOSIS — R7303 Prediabetes: Secondary | ICD-10-CM

## 2022-11-27 DIAGNOSIS — I10 Essential (primary) hypertension: Secondary | ICD-10-CM

## 2022-11-27 DIAGNOSIS — E669 Obesity, unspecified: Secondary | ICD-10-CM

## 2022-11-27 NOTE — Patient Instructions (Signed)
Preventive Care 40-62 Years Old, Female Preventive care refers to lifestyle choices and visits with your health care provider that can promote health and wellness. Preventive care visits are also called wellness exams. What can I expect for my preventive care visit? Counseling Your health care provider may ask you questions about your: Medical history, including: Past medical problems. Family medical history. Pregnancy history. Current health, including: Menstrual cycle. Method of birth control. Emotional well-being. Home life and relationship well-being. Sexual activity and sexual health. Lifestyle, including: Alcohol, nicotine or tobacco, and drug use. Access to firearms. Diet, exercise, and sleep habits. Work and work environment. Sunscreen use. Safety issues such as seatbelt and bike helmet use. Physical exam Your health care provider will check your: Height and weight. These may be used to calculate your BMI (body mass index). BMI is a measurement that tells if you are at a healthy weight. Waist circumference. This measures the distance around your waistline. This measurement also tells if you are at a healthy weight and may help predict your risk of certain diseases, such as type 2 diabetes and high blood pressure. Heart rate and blood pressure. Body temperature. Skin for abnormal spots. What immunizations do I need?  Vaccines are usually given at various ages, according to a schedule. Your health care provider will recommend vaccines for you based on your age, medical history, and lifestyle or other factors, such as travel or where you work. What tests do I need? Screening Your health care provider may recommend screening tests for certain conditions. This may include: Lipid and cholesterol levels. Diabetes screening. This is done by checking your blood sugar (glucose) after you have not eaten for a while (fasting). Pelvic exam and Pap test. Hepatitis B test. Hepatitis C  test. HIV (human immunodeficiency virus) test. STI (sexually transmitted infection) testing, if you are at risk. Lung cancer screening. Colorectal cancer screening. Mammogram. Talk with your health care provider about when you should start having regular mammograms. This may depend on whether you have a family history of breast cancer. BRCA-related cancer screening. This may be done if you have a family history of breast, ovarian, tubal, or peritoneal cancers. Bone density scan. This is done to screen for osteoporosis. Talk with your health care provider about your test results, treatment options, and if necessary, the need for more tests. Follow these instructions at home: Eating and drinking  Eat a diet that includes fresh fruits and vegetables, whole grains, lean protein, and low-fat dairy products. Take vitamin and mineral supplements as recommended by your health care provider. Do not drink alcohol if: Your health care provider tells you not to drink. You are pregnant, may be pregnant, or are planning to become pregnant. If you drink alcohol: Limit how much you have to 0-1 drink a day. Know how much alcohol is in your drink. In the [place] one drink equals one 12 oz bottle of beer (355 mL), one 5 oz glass of wine (148 mL), or one 1 oz glass of hard liquor (44 mL). Lifestyle Brush your teeth every morning and night with fluoride toothpaste. Floss one time each day. Exercise for at least 30 minutes 5 or more days each week. Do not use any products that contain nicotine or tobacco. These products include cigarettes, chewing tobacco, and vaping devices, such as e-cigarettes. If you need help quitting, ask your health care provider. Do not use drugs. If you are sexually active, practice safe sex. Use a condom or other form of protection to   prevent STIs. If you do not wish to become pregnant, use a form of birth control. If you plan to become pregnant, see your health care provider for a  prepregnancy visit. Take aspirin only as told by your health care provider. Make sure that you understand how much to take and what form to take. Work with your health care provider to find out whether it is safe and beneficial for you to take aspirin daily. Find healthy ways to manage stress, such as: Meditation, yoga, or listening to music. Journaling. Talking to a trusted person. Spending time with friends and family. Minimize exposure to UV radiation to reduce your risk of skin cancer. Safety Always wear your seat belt while driving or riding in a vehicle. Do not drive: If you have been drinking alcohol. Do not ride with someone who has been drinking. When you are tired or distracted. While texting. If you have been using any mind-altering substances or drugs. Wear a helmet and other protective equipment during sports activities. If you have firearms in your house, make sure you follow all gun safety procedures. Seek help if you have been physically or sexually abused. What's next? Visit your health care provider once a year for an annual wellness visit. Ask your health care provider how often you should have your eyes and teeth checked. Stay up to date on all vaccines. This information is not intended to replace advice given to you by your health care provider. Make sure you discuss any questions you have with your health care provider. Document Revised: 04/09/2021 Document Reviewed: 04/09/2021 Elsevier Patient Education  2023 Elsevier Inc.  

## 2022-12-03 ENCOUNTER — Other Ambulatory Visit: Payer: Self-pay | Admitting: Obstetrics and Gynecology

## 2022-12-03 ENCOUNTER — Telehealth: Payer: Self-pay

## 2022-12-03 DIAGNOSIS — N6311 Unspecified lump in the right breast, upper outer quadrant: Secondary | ICD-10-CM

## 2022-12-03 NOTE — Telephone Encounter (Signed)
Pt called triage needing to know if we have received a form for a physical. She states she had it faxed here.

## 2022-12-03 NOTE — Telephone Encounter (Signed)
Fax number is [phone], Pt is aware you are working on it now. And will fax when done.

## 2022-12-03 NOTE — Telephone Encounter (Signed)
[employer] form filled out and faxed.  Copy up front for pt.  Pt aware via JP.

## 2022-12-07 ENCOUNTER — Encounter: Payer: Self-pay | Admitting: Emergency Medicine

## 2022-12-07 ENCOUNTER — Ambulatory Visit
Admission: EM | Admit: 2022-12-07 | Discharge: 2022-12-07 | Disposition: A | Discharge status: Home / Self Care | Payer: No Typology Code available for payment source | Attending: Physician Assistant | Admitting: Physician Assistant

## 2022-12-07 DIAGNOSIS — R051 Acute cough: Secondary | ICD-10-CM | POA: Diagnosis not present

## 2022-12-07 DIAGNOSIS — U071 COVID-19: Secondary | ICD-10-CM

## 2022-12-07 DIAGNOSIS — R110 Nausea: Secondary | ICD-10-CM

## 2022-12-07 LAB — SARS CORONAVIRUS 2 BY RT PCR: SARS Coronavirus 2 by RT PCR: POSITIVE — AB

## 2022-12-07 MED ORDER — PROMETHAZINE-DM 6.25-15 MG/5ML PO SYRP: 5.0000 mL | ORAL_SOLUTION | Freq: Four times a day (QID) | ORAL | 0 refills | Status: DC | PRN

## 2022-12-07 NOTE — ED Provider Notes (Signed)
[facility]    CSN: [id number] Arrival date & time: 12/07/22  0801      History   Chief Complaint Chief Complaint  Patient presents with   Cough   Nausea    HPI Sarah Savage is a 62 y.o. female presenting for 4-day history of cough, congestion, nausea.  Reports sore throat at onset which has resolved.  No fever, fatigue, body aches, breathing difficulty.  Reports feeling better today but works at a daycare and wants to see if she has anything potentially contagious like COVID, RSV or flu before returning.  Has been taking Delsym with some relief of symptoms.  No other concerns.  HPI  Past Medical History:  Diagnosis Date   Arthritis    Hyperglycemia    Hyperlipidemia    Hypertension    Plantar fasciitis     Patient Active Problem List   Diagnosis Date Noted   S/P bilateral breast reduction 04/02/2020   S/P abdominoplasty 04/02/2020   S/P TAH-BSO 09/12/2018   History of cesarean section 08/11/2018   Obese 06/22/2018   S/P right UKR 06/21/2018   Menopause 03/16/2018   Bulky or enlarged uterus 03/16/2018   Trichomonas vaginitis 11/16/2017   Bacterial vaginosis 09/28/2017   Increased BMI 02/11/2016   Vaginal atrophy 02/11/2016   Leg pain, bilateral 06/03/2015   BP (high blood pressure) 04/07/2014   Blood glucose elevated 04/07/2014   HLD (hyperlipidemia) 04/07/2014   Plantar fasciitis 04/07/2014   Gastroduodenal ulcer 04/07/2014   LBP (low back pain) 04/11/2013   Arthralgia, sacroiliac 04/11/2013    Past Surgical History:  Procedure Laterality Date   ABDOMINOPLASTY  2021   BREAST REDUCTION SURGERY Bilateral 2021   ENDOMETRIAL ABLATION     HYSTERECTOMY ABDOMINAL WITH SALPINGO-OOPHORECTOMY Bilateral 09/12/2018   Procedure: HYSTERECTOMY ABDOMINAL WITH BILATERAL SALPINGO-OOPHORECTOMY;  Surgeon: Brayton Mars, MD;  Location: [facility] ORS;  Service: Gynecology;  Laterality: Bilateral;   PARTIAL KNEE ARTHROPLASTY Right 06/21/2018    Procedure: RIGHT UNICOMPARTMENTAL KNEE-MEDIALLY;  Surgeon: Paralee Cancel, MD;  Location: [facility];  Service: Orthopedics;  Laterality: Right;  70 mins   REDUCTION MAMMAPLASTY  03/2020   STOMACH SURGERY     excision of ulcer   toenail removal   1990s   UPPER GI ENDOSCOPY      OB History     Gravida  4   Para  1   Term  1   Preterm      AB  3   Living  1      SAB      IAB  3   Ectopic      Multiple      Live Births  1            Home Medications    Prior to Admission medications   Medication Sig Start Date End Date Taking? Authorizing Provider  promethazine-dextromethorphan (PROMETHAZINE-DM) 6.25-15 MG/5ML syrup Take 5 mLs by mouth 4 (four) times daily as needed. 12/07/22  Yes Danton Clap, PA-C  bisoprolol-hydrochlorothiazide (ZIAC) 5-6.25 MG tablet Take 1 tablet by mouth daily. 03/19/20 11/27/22  [provider]  metFORMIN (GLUCOPHAGE) 500 MG tablet Take 500 mg by mouth 2 (two) times daily.    [provider]    Family History Family History  Problem Relation Age of Onset   Hypertension Mother    Dementia Mother    Hypertension Father    Arthritis Father    Stroke Brother    Hypertension Brother  Diabetes Brother    Cancer Maternal Grandmother        Breast   Breast cancer Maternal Grandmother 60    Social History Social History   Tobacco Use   Smoking status: Never   Smokeless tobacco: Never  Vaping Use   Vaping Use: Never used  Substance Use Topics   Alcohol use: Not Currently    Alcohol/week: 0.0 standard drinks of alcohol    Comment: occassional   Drug use: No     Allergies   Tilactase and Penicillins   Review of Systems Review of Systems  Constitutional:  Negative for chills, diaphoresis, fatigue and fever.  HENT:  Positive for congestion and rhinorrhea. Negative for ear pain, sinus pressure, sinus pain and sore throat.   Respiratory:  Positive for cough. Negative for shortness of breath.    Gastrointestinal:  Positive for nausea. Negative for abdominal pain, diarrhea and vomiting.  Musculoskeletal:  Negative for arthralgias and myalgias.  Skin:  Negative for rash.  Neurological:  Negative for weakness and headaches.  Hematological:  Negative for adenopathy.     Physical Exam Triage Vital Signs ED Triage Vitals  Enc Vitals Group     BP      Pulse      Resp      Temp      Temp src      SpO2      Weight      Height      Head Circumference      Peak Flow      Pain Score      Pain Loc      Pain Edu?      Excl. in [place]?    No data found.  Updated Vital Signs BP 112/74 (BP Location: Right Arm)   Pulse 65   Temp 98.2 F (36.8 C) (Oral)   LMP  (LMP Unknown)   SpO2 96%      Physical Exam Vitals and nursing note reviewed.  Constitutional:      General: She is not in acute distress.    Appearance: Normal appearance. She is not ill-appearing or toxic-appearing.  HENT:     Head: Normocephalic and atraumatic.     Nose: Congestion present.     Mouth/Throat:     Mouth: Mucous membranes are moist.     Pharynx: Oropharynx is clear.  Eyes:     General: No scleral icterus.       Right eye: No discharge.        Left eye: No discharge.     Conjunctiva/sclera: Conjunctivae normal.  Cardiovascular:     Rate and Rhythm: Normal rate and regular rhythm.     Heart sounds: Normal heart sounds.  Pulmonary:     Effort: Pulmonary effort is normal. No respiratory distress.     Breath sounds: Normal breath sounds.  Musculoskeletal:     Cervical back: Neck supple.  Skin:    General: Skin is dry.  Neurological:     General: No focal deficit present.     Mental Status: She is alert. Mental status is at baseline.     Motor: No weakness.     Gait: Gait normal.  Psychiatric:        Mood and Affect: Mood normal.        Behavior: Behavior normal.        Thought Content: Thought content normal.      UC Treatments / Results  Labs (all labs ordered are listed,  but only  abnormal results are displayed) Labs Reviewed  SARS CORONAVIRUS 2 BY RT PCR - Abnormal; Notable for the following components:      Result Value   SARS Coronavirus 2 by RT PCR POSITIVE (*)    All other components within normal limits    EKG   Radiology No results found.  Procedures Procedures (including critical care time)  Medications Ordered in UC Medications - No data to display  Initial Impression / Assessment and Plan / UC Course  I have reviewed the triage vital signs and the nursing notes.  Pertinent labs & imaging results that were available during my care of the patient were reviewed by me and considered in my medical decision making (see chart for details).   62 year old female presents for 4-day history of cough, congestion and nausea.  No fever.  Improvement of symptoms today compared to onset.  Vitals normal and stable and patient overall well-appearing.  On exam has mild nasal congestion.  Throat clear.  Chest clear to auscultation.  PCR COVID test obtained.  Positive.  Reviewed results with patient.  Reviewed current CDC guidelines, isolation protocol and ED precautions. Supportive care encouraged with increasing rest and fluids.  Sent Promethazine DM to pharmacy.  Since patient is feeling better, will hold off on antiviral medication at this time.  Reviewed return and ER precautions.   Final Clinical Impressions(s) / UC Diagnoses   Final diagnoses:  COVID-19  Acute cough  Nausea     Discharge Instructions      -You are positive for COVID.  You should isolate until Wednesday and then wear a mask for 5 days. - Increase rest and fluids.  I sent cough medicine to the pharmacy. - Need to be seen again if you have an uncontrolled fever, weakness or breathing difficulty.     ED Prescriptions     Medication Sig Dispense Auth. Provider   promethazine-dextromethorphan (PROMETHAZINE-DM) 6.25-15 MG/5ML syrup Take 5 mLs by mouth 4 (four) times daily as  needed. 118 mL Danton Clap, PA-C      PDMP not reviewed this encounter.   Danton Clap, PA-C 12/07/22 [phone]

## 2022-12-07 NOTE — ED Triage Notes (Signed)
Pt presents with a cough and nausea x 4 days.

## 2022-12-07 NOTE — Discharge Instructions (Addendum)
-  You are positive for COVID.  You should isolate until Wednesday and then wear a mask for 5 days. - Increase rest and fluids.  I sent cough medicine to the pharmacy. - Need to be seen again if you have an uncontrolled fever, weakness or breathing difficulty.

## 2022-12-09 ENCOUNTER — Other Ambulatory Visit: Payer: No Typology Code available for payment source

## 2022-12-17 ENCOUNTER — Ambulatory Visit
Admission: RE | Admit: 2022-12-17 | Discharge: 2022-12-17 | Disposition: A | Discharge status: Home / Self Care | Payer: No Typology Code available for payment source | Source: Ambulatory Visit | Attending: Obstetrics and Gynecology | Admitting: Obstetrics and Gynecology

## 2022-12-17 DIAGNOSIS — N6311 Unspecified lump in the right breast, upper outer quadrant: Secondary | ICD-10-CM | POA: Diagnosis not present

## 2023-04-12 ENCOUNTER — Ambulatory Visit (INDEPENDENT_AMBULATORY_CARE_PROVIDER_SITE_OTHER): Payer: Self-pay

## 2023-04-12 ENCOUNTER — Ambulatory Visit
Admission: EM | Admit: 2023-04-12 | Discharge: 2023-04-12 | Disposition: A | Discharge status: Home / Self Care | Payer: Self-pay | Attending: Internal Medicine | Admitting: Internal Medicine

## 2023-04-12 DIAGNOSIS — M25561 Pain in right knee: Secondary | ICD-10-CM

## 2023-04-12 MED ORDER — NAPROXEN 375 MG PO TABS: 375.0000 mg | ORAL_TABLET | Freq: Two times a day (BID) | ORAL | 0 refills | Status: DC

## 2023-04-12 NOTE — ED Triage Notes (Signed)
Pt c/o R knee pain & swelling ongoing since today. Denies any falls or injuries.

## 2023-04-12 NOTE — ED Provider Notes (Signed)
[facility]    CSN: [id number] Arrival date & time: 04/12/23  1727      History   Chief Complaint Chief Complaint  Patient presents with   Knee Pain    HPI Sarah Savage is a 62 y.o. female with a history of partial knee replacement comes to urgent care with right knee pain of 1 day duration.  Patient celebrated her birthday over the weekend.  She went out with her friends and did a lot of dancing.  She denies any fall or trauma to the knee.  Pain started this morning.  Pain is of moderate severity, aggravated by bearing weight and not relieved with Tylenol use.  Right knee pain is associated with mild swelling of the right knee.  No redness or bruising of the right knee.  No fever or chills.   HPI  Past Medical History:  Diagnosis Date   Arthritis    Hyperglycemia    Hyperlipidemia    Hypertension    Plantar fasciitis     Patient Active Problem List   Diagnosis Date Noted   S/P bilateral breast reduction 04/02/2020   S/P abdominoplasty 04/02/2020   S/P TAH-BSO 09/12/2018   History of cesarean section 08/11/2018   Obese 06/22/2018   S/P right UKR 06/21/2018   Menopause 03/16/2018   Bulky or enlarged uterus 03/16/2018   Trichomonas vaginitis 11/16/2017   Bacterial vaginosis 09/28/2017   Increased BMI 02/11/2016   Vaginal atrophy 02/11/2016   Leg pain, bilateral 06/03/2015   BP (high blood pressure) 04/07/2014   Blood glucose elevated 04/07/2014   HLD (hyperlipidemia) 04/07/2014   Plantar fasciitis 04/07/2014   Gastroduodenal ulcer 04/07/2014   LBP (low back pain) 04/11/2013   Arthralgia, sacroiliac 04/11/2013    Past Surgical History:  Procedure Laterality Date   ABDOMINOPLASTY  2021   BREAST REDUCTION SURGERY Bilateral 2021   ENDOMETRIAL ABLATION     HYSTERECTOMY ABDOMINAL WITH SALPINGO-OOPHORECTOMY Bilateral 09/12/2018   Procedure: HYSTERECTOMY ABDOMINAL WITH BILATERAL SALPINGO-OOPHORECTOMY;  Surgeon: Herold Harms, MD;   Location: [facility];  Service: Gynecology;  Laterality: Bilateral;   PARTIAL KNEE ARTHROPLASTY Right 06/21/2018   Procedure: RIGHT UNICOMPARTMENTAL KNEE-MEDIALLY;  Surgeon: Durene Romans, MD;  Location: [facility];  Service: Orthopedics;  Laterality: Right;  70 mins   REDUCTION MAMMAPLASTY  03/2020   STOMACH SURGERY     excision of ulcer   toenail removal   1990s   UPPER GI ENDOSCOPY      OB History     Gravida  4   Para  1   Term  1   Preterm      AB  3   Living  1      SAB      IAB  3   Ectopic      Multiple      Live Births  1            Home Medications    Prior to Admission medications   Medication Sig Start Date End Date Taking? Authorizing Provider  bisoprolol-hydrochlorothiazide (ZIAC) 5-6.25 MG tablet Take 1 tablet by mouth daily. 03/19/20 04/12/23 Yes [provider]  naproxen (NAPROSYN) 375 MG tablet Take 1 tablet (375 mg total) by mouth 2 (two) times daily. 04/12/23  Yes Pierra Skora, Britta Mccreedy, MD    Family History Family History  Problem Relation Age of Onset   Hypertension Mother    Dementia Mother    Hypertension Father    Arthritis Father  Stroke Brother    Hypertension Brother    Diabetes Brother    Cancer Maternal Grandmother        Breast   Breast cancer Maternal Grandmother 62    Social History Social History   Tobacco Use   Smoking status: Never   Smokeless tobacco: Never  Vaping Use   Vaping Use: Never used  Substance Use Topics   Alcohol use: Not Currently    Alcohol/week: 0.0 standard drinks of alcohol    Comment: occassional   Drug use: No     Allergies   Tilactase and Penicillins   Review of Systems Review of Systems As per HPI  Physical Exam Triage Vital Signs ED Triage Vitals  Enc Vitals Group     BP 04/12/23 1743 131/84     Pulse Rate 04/12/23 1743 65     Resp 04/12/23 1743 16     Temp 04/12/23 1743 98.9 F (37.2 C)     Temp Source 04/12/23 1743 Oral     SpO2 04/12/23 1743 97 %     Weight  04/12/23 1742 165 lb (74.8 kg)     Height 04/12/23 1742 5\' 6"  (1.676 m)     Head Circumference --      Peak Flow --      Pain Score 04/12/23 1756 10     Pain Loc --      Pain Edu? --      Excl. in GC? --    No data found.  Updated Vital Signs BP 131/84 (BP Location: Left Arm)   Pulse 65   Temp 98.9 F (37.2 C) (Oral)   Resp 16   Ht 5\' 6"  (1.676 m)   Wt 74.8 kg   LMP  (LMP Unknown)   SpO2 97%   BMI 26.63 kg/m   Visual Acuity Right Eye Distance:   Left Eye Distance:   Bilateral Distance:    Right Eye Near:   Left Eye Near:    Bilateral Near:     Physical Exam Vitals and nursing note reviewed.  Constitutional:      General: She is in acute distress.     Appearance: She is not ill-appearing.  Cardiovascular:     Rate and Rhythm: Normal rate and regular rhythm.  Musculoskeletal:     Comments: Limited range of motion of the right knee secondary to pain.  No bruising of the right knee.  Mild joint effusion.  Neurological:     Mental Status: She is alert.      UC Treatments / Results  Labs (all labs ordered are listed, but only abnormal results are displayed) Labs Reviewed - No data to display  EKG   Radiology DG Knee Complete 4 Views Right  Result Date: 04/12/2023 CLINICAL DATA:  Right knee pain. EXAM: RIGHT KNEE - COMPLETE 4+ VIEW COMPARISON:  None Available. FINDINGS: There is arthroplasty of the medial compartment of the knee. The arthroplasty components appear intact and in anatomic alignment. There is no acute fracture or dislocation. The bones are osteopenic. There is mild arthritic changes of the knee. There is moderate suprapatellar effusion. The soft tissues are unremarkable. IMPRESSION: 1. No acute fracture or dislocation. 2. Moderate suprapatellar effusion. Electronically Signed   By: Elgie Collard M.D.   On: 04/12/2023 19:25    Procedures Procedures (including critical care time)  Medications Ordered in UC Medications - No data to  display  Initial Impression / Assessment and Plan / UC Course  I have reviewed  the triage vital signs and the nursing notes.  Pertinent labs & imaging results that were available during my care of the patient were reviewed by me and considered in my medical decision making (see chart for details).     1.  Right knee sprain with effusion: X-ray of the right knee is negative for fracture.  Arthroplasty components are intact and in anatomic alignment. Right knee brace Naproxen 375 mg twice daily Gentle range of motion exercises Icing of the right knee Return precautions given.  Final Clinical Impressions(s) / UC Diagnoses   Final diagnoses:  Acute pain of right knee     Discharge Instructions      Please wear your knee brace. Your x-ray is negative for fracture. Rest and elevate the affected painful area. Apply cold compresses intermittently as needed. Please take medications as prescribed. As pain recedes, begin normal activities slowly as tolerated. Please return to urgent care if symptoms worsen.        ED Prescriptions     Medication Sig Dispense Auth. Provider   naproxen (NAPROSYN) 375 MG tablet Take 1 tablet (375 mg total) by mouth 2 (two) times daily. 20 tablet Leonetta Mcgivern, Britta Mccreedy, MD      PDMP not reviewed this encounter.   Merrilee Jansky, MD 04/12/23 1944

## 2023-04-12 NOTE — Discharge Instructions (Addendum)
Please wear your knee brace. Your x-ray is negative for fracture. Rest and elevate the affected painful area. Apply cold compresses intermittently as needed. Please take medications as prescribed. As pain recedes, begin normal activities slowly as tolerated. Please return to urgent care if symptoms worsen.

## 2023-05-04 ENCOUNTER — Other Ambulatory Visit: Payer: Self-pay | Admitting: Internal Medicine

## 2023-05-04 DIAGNOSIS — Z1231 Encounter for screening mammogram for malignant neoplasm of breast: Secondary | ICD-10-CM

## 2023-06-15 ENCOUNTER — Ambulatory Visit
Admission: RE | Admit: 2023-06-15 | Discharge: 2023-06-15 | Disposition: A | Discharge status: Home / Self Care | Payer: No Typology Code available for payment source | Source: Ambulatory Visit | Attending: Internal Medicine | Admitting: Internal Medicine

## 2023-06-15 DIAGNOSIS — Z1231 Encounter for screening mammogram for malignant neoplasm of breast: Secondary | ICD-10-CM | POA: Insufficient documentation

## 2023-08-13 ENCOUNTER — Encounter: Payer: Self-pay | Admitting: Obstetrics and Gynecology

## 2023-08-13 ENCOUNTER — Ambulatory Visit: Payer: No Typology Code available for payment source | Admitting: Obstetrics and Gynecology

## 2023-08-13 VITALS — BP 118/72 | HR 46 | Resp 16 | Ht 63.25 in | Wt 166.7 lb

## 2023-08-13 DIAGNOSIS — M5489 Other dorsalgia: Secondary | ICD-10-CM | POA: Diagnosis not present

## 2023-08-13 MED ORDER — IBUPROFEN 600 MG PO TABS: 600.0000 mg | ORAL_TABLET | Freq: Four times a day (QID) | ORAL | 1 refills | Status: AC | PRN

## 2023-08-13 MED ORDER — CYCLOBENZAPRINE HCL 10 MG PO TABS: 10.0000 mg | ORAL_TABLET | Freq: Three times a day (TID) | ORAL | 1 refills | Status: AC | PRN

## 2023-08-13 NOTE — Progress Notes (Signed)
    GYNECOLOGY PROGRESS NOTE  Subjective:    Patient ID: Sarah Savage, female    DOB: 08-21-1961, 62 y.o.   MRN: [id number]  HPI  Patient is a 62 y.o. G17P1031 female who presents for evaluation of breast pain. She describes the pain as constant and sharp with swelling. Her last mammogram was 06/15/23 and it was normal. She had breast reduction surgery around 2021. Pain is mostly felt in the upper left back pain and arm pain beneath the axillary region for  x 2-3 weeks. She has limited ROM and  pain when she lays on her back. She has been using hydrocortisone cream to relieve the pain. Denies any recent incidents of heavy lifting/pushing/pulling or trauma.  She has had a recent mammogram in August which was normal.   The following portions of the patient's history were reviewed and updated as appropriate: allergies, current medications, past family history, past medical history, past social history, past surgical history, and problem list.  Review of Systems Pertinent items noted in HPI and remainder of comprehensive ROS otherwise negative.   Objective:   Blood pressure 118/72, pulse (!) 46, resp. rate 16, height 5' 3.25" (1.607 m), weight 166 lb 11.2 oz (75.6 kg).  Body mass index is 29.3 kg/m. General appearance: alert and no distress Breasts: breasts appear normal, no suspicious masses, no skin or nipple changes or axillary nodes. Well healed reduction mammoplasty scars bilaterally.  Back: No skin lesions, erythema, , symmetric, no curvature. ROM normal. Tenderness to palpation along scapula region on left.    Assessment:   1. Other acute back pain      Plan:   - Advised on use of topical products such as [employer] or Biofreeze, discontinue use of hydrocortisone cream. Also encouraged use of NSAIDs alternating with Tylenol.  Additionally given short supply of muscle relaxant. Advised that if pain does not improve over the next week, may need to f/u with PCP for further  evaluation.  Patient notes understanding.     Hildred Laser, MD [facility]

## 2023-11-15 ENCOUNTER — Other Ambulatory Visit
Admission: RE | Admit: 2023-11-15 | Discharge: 2023-11-15 | Disposition: A | Discharge status: Home / Self Care | Payer: Self-pay | Source: Ambulatory Visit | Attending: Physician Assistant | Admitting: Physician Assistant

## 2023-11-15 DIAGNOSIS — R0789 Other chest pain: Secondary | ICD-10-CM | POA: Insufficient documentation

## 2023-11-15 LAB — TROPONIN I (HIGH SENSITIVITY): Troponin I (High Sensitivity): 4 ng/L (ref <18)

## 2023-11-25 ENCOUNTER — Other Ambulatory Visit: Payer: Self-pay | Admitting: Internal Medicine

## 2023-11-25 DIAGNOSIS — E7800 Pure hypercholesterolemia, unspecified: Secondary | ICD-10-CM

## 2024-01-04 ENCOUNTER — Inpatient Hospital Stay: Admission: RE | Admit: 2024-01-04 | Payer: Self-pay | Source: Ambulatory Visit

## 2024-01-06 ENCOUNTER — Encounter: Payer: Self-pay | Admitting: Obstetrics and Gynecology

## 2024-01-06 ENCOUNTER — Ambulatory Visit (INDEPENDENT_AMBULATORY_CARE_PROVIDER_SITE_OTHER): Payer: Self-pay | Admitting: Obstetrics and Gynecology

## 2024-01-06 VITALS — BP 101/67 | HR 63 | Resp 16 | Ht 63.25 in | Wt 162.2 lb

## 2024-01-06 DIAGNOSIS — Z01419 Encounter for gynecological examination (general) (routine) without abnormal findings: Secondary | ICD-10-CM | POA: Diagnosis not present

## 2024-01-06 DIAGNOSIS — N952 Postmenopausal atrophic vaginitis: Secondary | ICD-10-CM

## 2024-01-06 DIAGNOSIS — Z90710 Acquired absence of both cervix and uterus: Secondary | ICD-10-CM

## 2024-01-06 DIAGNOSIS — Z1211 Encounter for screening for malignant neoplasm of colon: Secondary | ICD-10-CM

## 2024-01-06 DIAGNOSIS — N898 Other specified noninflammatory disorders of vagina: Secondary | ICD-10-CM

## 2024-01-06 DIAGNOSIS — N644 Mastodynia: Secondary | ICD-10-CM

## 2024-01-06 NOTE — Patient Instructions (Signed)
 Preventive Care 63-63 Years Old, Female Preventive care refers to lifestyle choices and visits with your health care provider that can promote health and wellness. Preventive care visits are also called wellness exams. What can I expect for my preventive care visit? Counseling Your health care provider may ask you questions about your: Medical history, including: Past medical problems. Family medical history. Pregnancy history. Current health, including: Menstrual cycle. Method of birth control. Emotional well-being. Home life and relationship well-being. Sexual activity and sexual health. Lifestyle, including: Alcohol, nicotine or tobacco, and drug use. Access to firearms. Diet, exercise, and sleep habits. Work and work Astronomer. Sunscreen use. Safety issues such as seatbelt and bike helmet use. Physical exam Your health care provider will check your: Height and weight. These may be used to calculate your BMI (body mass index). BMI is a measurement that tells if you are at a healthy weight. Waist circumference. This measures the distance around your waistline. This measurement also tells if you are at a healthy weight and may help predict your risk of certain diseases, such as type 2 diabetes and high blood pressure. Heart rate and blood pressure. Body temperature. Skin for abnormal spots. What immunizations do I need?  Vaccines are usually given at various ages, according to a schedule. Your health care provider will recommend vaccines for you based on your age, medical history, and lifestyle or other factors, such as travel or where you work. What tests do I need? Screening Your health care provider may recommend screening tests for certain conditions. This may include: Lipid and cholesterol levels. Diabetes screening. This is done by checking your blood sugar (glucose) after you have not eaten for a while (fasting). Pelvic exam and Pap test. Hepatitis B test. Hepatitis C  test. HIV (human immunodeficiency virus) test. STI (sexually transmitted infection) testing, if you are at risk. Lung cancer screening. Colorectal cancer screening. Mammogram. Talk with your health care provider about when you should start having regular mammograms. This may depend on whether you have a family history of breast cancer. BRCA-related cancer screening. This may be done if you have a family history of breast, ovarian, tubal, or peritoneal cancers. Bone density scan. This is done to screen for osteoporosis. Talk with your health care provider about your test results, treatment options, and if necessary, the need for more tests. Follow these instructions at home: Eating and drinking  Eat a diet that includes fresh fruits and vegetables, whole grains, lean protein, and low-fat dairy products. Take vitamin and mineral supplements as recommended by your health care provider. Do not drink alcohol if: Your health care provider tells you not to drink. You are pregnant, may be pregnant, or are planning to become pregnant. If you drink alcohol: Limit how much you have to 0-1 drink a day. Know how much alcohol is in your drink. In the [place] one drink equals one 12 oz bottle of beer (355 mL), one 5 oz glass of wine (148 mL), or one 1 oz glass of hard liquor (44 mL). Lifestyle Brush your teeth every morning and night with fluoride toothpaste. Floss one time each day. Exercise for at least 30 minutes 5 or more days each week. Do not use any products that contain nicotine or tobacco. These products include cigarettes, chewing tobacco, and vaping devices, such as e-cigarettes. If you need help quitting, ask your health care provider. Do not use drugs. If you are sexually active, practice safe sex. Use a condom or other form of protection to  prevent STIs. If you do not wish to become pregnant, use a form of birth control. If you plan to become pregnant, see your health care provider for a  prepregnancy visit. Take aspirin only as told by your health care provider. Make sure that you understand how much to take and what form to take. Work with your health care provider to find out whether it is safe and beneficial for you to take aspirin daily. Find healthy ways to manage stress, such as: Meditation, yoga, or listening to music. Journaling. Talking to a trusted person. Spending time with friends and family. Minimize exposure to UV radiation to reduce your risk of skin cancer. Safety Always wear your seat belt while driving or riding in a vehicle. Do not drive: If you have been drinking alcohol. Do not ride with someone who has been drinking. When you are tired or distracted. While texting. If you have been using any mind-altering substances or drugs. Wear a helmet and other protective equipment during sports activities. If you have firearms in your house, make sure you follow all gun safety procedures. Seek help if you have been physically or sexually abused. What's next? Visit your health care provider once a year for an annual wellness visit. Ask your health care provider how often you should have your eyes and teeth checked. Stay up to date on all vaccines. This information is not intended to replace advice given to you by your health care provider. Make sure you discuss any questions you have with your health care provider. Document Revised: 04/09/2021 Document Reviewed: 04/09/2021 Elsevier Patient Education  2024 [employer] Breast Self-Awareness Breast self-awareness is knowing how your breasts look and feel. You need to: Check your breasts on a regular basis. Tell your doctor about any changes. Become familiar with the look and feel of your breasts. This can help you catch a breast problem while it is still small and can be treated. You should do breast self-exams even if you have breast implants. What you need: A mirror. A well-lit room. A pillow or other  soft object. How to do a breast self-exam Follow these steps to do a breast self-exam: Look for changes  Take off all the clothes above your waist. Stand in front of a mirror in a room with good lighting. Put your hands down at your sides. Compare your breasts in the mirror. Look for any difference between them, such as: A difference in shape. A difference in size. Wrinkles, dips, and bumps in one breast and not the other. Look at each breast for changes in the skin, such as: Redness. Scaly areas. Skin that has gotten thicker. Dimpling. Open sores (ulcers). Look for changes in your nipples, such as: Fluid coming out of a nipple. Fluid around a nipple. Bleeding. Dimpling. Redness. A nipple that looks pushed in (retracted), or that has changed position. Feel for changes Lie on your back. Feel each breast. To do this: Pick a breast to feel. Place a pillow under the shoulder closest to that breast. Put the arm closest to that breast behind your head. Feel the nipple area of that breast using the hand of your other arm. Feel the area with the pads of your three middle fingers by making small circles with your fingers. Use light, medium, and firm pressure. Continue the overlapping circles, moving downward over the breast. Keep making circles with your fingers. Stop when you feel your ribs. Start making circles with your fingers again, this time going  upward until you reach your collarbone. Then, make circles outward across your breast and into your armpit area. Squeeze your nipple. Check for discharge and lumps. Repeat these steps to check your other breast. Sit or stand in the tub or shower. With soapy water on your skin, feel each breast the same way you did when you were lying down. Write down what you find Writing down what you find can help you remember what to tell your doctor. Write down: What is normal for each breast. Any changes you find in each breast. These  include: The kind of changes you find. A tender or painful breast. Any lump you find. Write down its size and where it is. When you last had your monthly period (menstrual cycle). General tips If you are breastfeeding, the best time to check your breasts is after you feed your baby or after you use a breast pump. If you get monthly bleeding, the best time to check your breasts is 5-7 days after your monthly cycle ends. With time, you will become comfortable with the self-exam. You will also start to know if there are changes in your breasts. Contact a doctor if: You see a change in the shape or size of your breasts or nipples. You see a change in the skin of your breast or nipples, such as red or scaly skin. You have fluid coming from your nipples that is not normal. You find a new lump or thick area. You have breast pain. You have any concerns about your breast health. Summary Breast self-awareness includes looking for changes in your breasts and feeling for changes within your breasts. You should do breast self-awareness in front of a mirror in a well-lit room. If you get monthly periods (menstrual cycles), the best time to check your breasts is 5-7 days after your period ends. Tell your doctor about any changes you see in your breasts. Changes include changes in size, changes on the skin, painful or tender breasts, or fluid from your nipples that is not normal. This information is not intended to replace advice given to you by your health care provider. Make sure you discuss any questions you have with your health care provider. Document Revised: 03/19/2022 Document Reviewed: 08/14/2021 Elsevier Patient Education  2024 [employer].

## 2024-01-06 NOTE — Progress Notes (Signed)
 ANNUAL PREVENTATIVE CARE GYNECOLOGY  ENCOUNTER NOTE  Subjective:       Sarah Savage is a 63 y.o. [phone] female here for a routine annual gynecologic exam.  The patient is not sexually active. The patient is not taking hormone replacement therapy. Patient denies post-menopausal vaginal bleeding. The patient wears seatbelts: yes. The patient participates in regular exercise: no. Has the patient ever been transfused or tattooed?: no. The patient reports that there is not domestic violence in her life.   Current complaints: 1.  She notes bilateral breast pain x 2 months, pain is throbbing and comes and goes, located on sides of breasts. Has not had any changes in her bras, medications, or activities. Has not tried anything for her pain.  2.  Has questions as to whether douching is still appropriate. Feels like she occasionally notes a vaginal odor. Denies vaginal discharge.     Gynecologic History No LMP recorded (patient is postmenopausal and has had a hysterectomy). Last Pap: 02/11/2016. Results were: normal. H/o abnormal pap in 2016 (ASCUS, HR HPV+, with normal colposcopy).  History of STI's: Trichomonas in 10/2017.  Last mammogram: 06/15/2023. Results were: normal Last Colonoscopy: Patient has never had one.  Last Dexa Scan: 06/27/2019. Results were: normal.    Obstetric History OB History  Gravida Para Term Preterm AB Living  4 1 1  3 1   SAB IAB Ectopic Multiple Live Births   3   1    # Outcome Date GA Lbr Len/2nd Weight Sex Type Anes PTL Lv  4 Term 1991   9 lb 12.8 oz (4.445 kg) M CS-LTranv   LIV  3 IAB 1990          2 IAB 1985          1 IAB             Past Medical History:  Diagnosis Date   Arthritis    Hyperglycemia    Hyperlipidemia    Hypertension    Plantar fasciitis     Family History  Problem Relation Age of Onset   Hypertension Mother    Dementia Mother    Hypertension Father    Arthritis Father    Stroke Brother    Hypertension Brother     Diabetes Brother    Cancer Maternal Grandmother        Breast   Breast cancer Maternal Grandmother 46    Past Surgical History:  Procedure Laterality Date   ABDOMINOPLASTY  2021   BREAST REDUCTION SURGERY Bilateral 2021   ENDOMETRIAL ABLATION     HYSTERECTOMY ABDOMINAL WITH SALPINGO-OOPHORECTOMY Bilateral 09/12/2018   Procedure: HYSTERECTOMY ABDOMINAL WITH BILATERAL SALPINGO-OOPHORECTOMY;  Surgeon: Herold Harms, MD;  Location: [facility] ORS;  Service: Gynecology;  Laterality: Bilateral;   PARTIAL KNEE ARTHROPLASTY Right 06/21/2018   Procedure: RIGHT UNICOMPARTMENTAL KNEE-MEDIALLY;  Surgeon: Durene Romans, MD;  Location: [facility];  Service: Orthopedics;  Laterality: Right;  70 mins   REDUCTION MAMMAPLASTY  03/2020   STOMACH SURGERY     excision of ulcer   toenail removal   1990s   UPPER GI ENDOSCOPY      Social History   Socioeconomic History   Marital status: Married    Spouse name: Not on file   Number of children: Not on file   Years of education: Not on file   Highest education level: Not on file  Occupational History   Not on file  Tobacco Use   Smoking status: Never  Smokeless tobacco: Never  Vaping Use   Vaping status: Never Used  Substance and Sexual Activity   Alcohol use: Not Currently    Alcohol/week: 0.0 standard drinks of alcohol    Comment: occassional   Drug use: No   Sexual activity: Not Currently    Birth control/protection: Surgical    Comment: hysterectomy  Other Topics Concern   Not on file  Social History Narrative   Not on file   Social Drivers of Health   Financial Resource Strain: Not on file  Food Insecurity: Not on file  Transportation Needs: Not on file  Physical Activity: Inactive (03/16/2018)   Exercise Vital Sign    Days of Exercise per Week: 0 days    Minutes of Exercise per Session: 0 min  Stress: Not on file  Social Connections: Not on file  Intimate Partner Violence: Not on file    Current Outpatient Medications on  File Prior to Visit  Medication Sig Dispense Refill   bisoprolol-hydrochlorothiazide (ZIAC) 5-6.25 MG tablet Take 1 tablet by mouth daily.     cyclobenzaprine (FLEXERIL) 10 MG tablet Take 1 tablet (10 mg total) by mouth 3 (three) times daily as needed for muscle spasms. 30 tablet 1   ibuprofen (ADVIL) 600 MG tablet Take 1 tablet (600 mg total) by mouth every 6 (six) hours as needed. 30 tablet 1   No current facility-administered medications on file prior to visit.    Allergies  Allergen Reactions   Tilactase Other (See Comments)    Found out via allergy testing   Penicillins Other (See Comments) and Rash    "I gives me a yeast infection" Has patient had a PCN reaction causing immediate rash, facial/tongue/throat swelling, SOB or lightheadedness with hypotension: No Has patient had a PCN reaction causing severe rash involving mucus membranes or skin necrosis: No Has patient had a PCN reaction that required hospitalization: No Has patient had a PCN reaction occurring within the last 10 years: No If all of the above answers are "NO", then may proceed with Cephalosporin use.  "I gives me a yeast infection"     Review of Systems ROS Review of Systems - General ROS: negative for - chills, fatigue, fever, hot flashes, night sweats, weight gain or weight loss Psychological ROS: negative for - anxiety, decreased libido, depression, mood swings, physical abuse or sexual abuse Ophthalmic ROS: negative for - blurry vision, eye pain or loss of vision ENT ROS: negative for - headaches, hearing change, visual changes or vocal changes Allergy and Immunology ROS: negative for - hives, itchy/watery eyes or seasonal allergies Hematological and Lymphatic ROS: negative for - bleeding problems, bruising, swollen lymph nodes or weight loss Endocrine ROS: negative for - galactorrhea, hair pattern changes, hot flashes, malaise/lethargy, mood swings, palpitations, polydipsia/polyuria, skin changes,  temperature intolerance or unexpected weight changes Breast ROS: negative for - new or changing breast lumps or nipple discharge. Positive for bilateral breast pain.  Respiratory ROS: negative for - cough or shortness of breath Cardiovascular ROS: negative for - chest pain, irregular heartbeat, palpitations or shortness of breath Gastrointestinal ROS: no abdominal pain, change in bowel habits, or black or bloody stools Genito-Urinary ROS: no dysuria, trouble voiding, or hematuria. Positive for occasional vaginal odor.  Musculoskeletal ROS: negative for - joint pain or joint stiffness Neurological ROS: negative for - bowel and bladder control changes Dermatological ROS: negative for rash and skin lesion changes   Objective:   BP 101/67   Pulse 63   Resp 16  Ht 5' 3.25" (1.607 m)   Wt 162 lb 3.2 oz (73.6 kg)   LMP  (LMP Unknown)   BMI 28.51 kg/m  CONSTITUTIONAL: Well-developed, well-nourished female in no acute distress.  PSYCHIATRIC: Normal mood and affect. Normal behavior. Normal judgment and thought content. NEUROLGIC: Alert and oriented to person, place, and time. Normal muscle tone coordination. No cranial nerve deficit noted. HENT:  Normocephalic, atraumatic, External right and left ear normal. Oropharynx is clear and moist EYES: Conjunctivae and EOM are normal. Pupils are equal, round, and reactive to light. No scleral icterus.  NECK: Normal range of motion, supple, no masses.  Normal thyroid.  SKIN: Skin is warm and dry. No rash noted. Not diaphoretic. No erythema. No pallor. CARDIOVASCULAR: Normal heart rate noted, regular rhythm, no murmur. RESPIRATORY: Clear to auscultation bilaterally. Effort and breath sounds normal, no problems with respiration noted. BREASTS: Symmetric in size. No masses, skin changes, nipple drainage, or lymphadenopathy. ABDOMEN: Soft, normal bowel sounds, no distention noted.  No tenderness, rebound or guarding.  BLADDER: Normal PELVIC:  Bladder no  bladder distension noted  Urethra: normal appearing urethra with no masses, tenderness or lesions  Vulva: normal appearing vulva with no masses, tenderness or lesions  Vagina: moderately atrophic, no lesions or discharge.   Cervix: surgically absent  Uterus: surgically absent, vaginal cuff well healed  Adnexa: surgically absent bilateral  RV: External Exam NormaI, No Rectal Masses, and Normal Sphincter tone  MUSCULOSKELETAL: Normal range of motion. No tenderness.  No cyanosis, clubbing, or edema.  2+ distal pulses. LYMPHATIC: No Axillary, Supraclavicular, or Inguinal Adenopathy.   Labs: Performed by PCP, reviewed in Care Everywhere  Assessment:   1. Encounter for well woman exam with routine gynecological exam   2. Colon cancer screening   3. Breast pain in female   4. Vaginal atrophy   5. Vaginal odor   6. S/P TAH-BSO      Plan:  - Pap: Not needed. Is s/p hysterectomy - Mammogram: Not due until August.  Ordered - Colon Screening:   Ordered cologuard - Labs:  Labs done by PCP - Routine preventative health maintenance measures emphasized:  Self Breast Exam, Exercise/Diet/Weight control, and Stress Management - Breast pain, bilateral. Advised on cold compresses, NSAIDs, magnesium and evening primrose oil. If no improvement, will recommend diagnostic mammogram.  - Vaginal atrophy, asymptomatic. No need for intervention at this time.  - Vaginal odor, no discharge or odor noted on exam. Discussed conservative management with pH balanced soaps, boric acid or tea tree oil suppositories, and consuming yogurt products or utilizing vaginal probiotics.  - Return to Clinic - 1 Year   Hildred Laser, MD [facility]

## 2024-01-17 LAB — COLOGUARD: COLOGUARD: NEGATIVE

## 2024-01-18 NOTE — Progress Notes (Signed)
 Sarah Savage,   Your Cologuard test was negative. Continue routine screening every 3 years.

## 2024-07-21 ENCOUNTER — Other Ambulatory Visit: Payer: Self-pay | Admitting: Internal Medicine

## 2024-07-21 DIAGNOSIS — Z1231 Encounter for screening mammogram for malignant neoplasm of breast: Secondary | ICD-10-CM

## 2024-08-07 ENCOUNTER — Ambulatory Visit (INDEPENDENT_AMBULATORY_CARE_PROVIDER_SITE_OTHER): Payer: Self-pay

## 2024-08-07 ENCOUNTER — Ambulatory Visit: Payer: Self-pay | Admitting: Podiatry

## 2024-08-07 VITALS — Ht 63.0 in | Wt 162.2 lb

## 2024-08-07 DIAGNOSIS — S82832A Other fracture of upper and lower end of left fibula, initial encounter for closed fracture: Secondary | ICD-10-CM

## 2024-08-07 DIAGNOSIS — M7752 Other enthesopathy of left foot: Secondary | ICD-10-CM | POA: Diagnosis not present

## 2024-08-09 NOTE — Progress Notes (Signed)
   Chief Complaint  Patient presents with   Ankle Pain    Pt is here due to left ankle pain, states last Monday she fell was seen at [facility] on that Tuesday, not sure how she injured the ankle states they did not explain well was given boot to wear, states the boot hurts the ankle more then walking without it.    HPI: 63 y.o. female presenting today for above complaint  Past Medical History:  Diagnosis Date   Arthritis    Hyperglycemia    Hyperlipidemia    Hypertension    Plantar fasciitis     Past Surgical History:  Procedure Laterality Date   ABDOMINOPLASTY  2021   BREAST REDUCTION SURGERY Bilateral 2021   ENDOMETRIAL ABLATION     HYSTERECTOMY ABDOMINAL WITH SALPINGO-OOPHORECTOMY Bilateral 09/12/2018   Procedure: HYSTERECTOMY ABDOMINAL WITH BILATERAL SALPINGO-OOPHORECTOMY;  Surgeon: Kathe Gladis LABOR, MD;  Location: [facility] ORS;  Service: Gynecology;  Laterality: Bilateral;   PARTIAL KNEE ARTHROPLASTY Right 06/21/2018   Procedure: RIGHT UNICOMPARTMENTAL KNEE-MEDIALLY;  Surgeon: Ernie Cough, MD;  Location: [facility];  Service: Orthopedics;  Laterality: Right;  70 mins   REDUCTION MAMMAPLASTY  03/2020   STOMACH SURGERY     excision of ulcer   toenail removal   1990s   UPPER GI ENDOSCOPY      Allergies  Allergen Reactions   Tilactase Other (See Comments)    Found out via allergy testing   Penicillins Other (See Comments) and Rash    I gives me a yeast infection Has patient had a PCN reaction causing immediate rash, facial/tongue/throat swelling, SOB or lightheadedness with hypotension: No Has patient had a PCN reaction causing severe rash involving mucus membranes or skin necrosis: No Has patient had a PCN reaction that required hospitalization: No Has patient had a PCN reaction occurring within the last 10 years: No If all of the above answers are NO, then may proceed with Cephalosporin use.  I gives me a yeast infection     Physical Exam: General: The  patient is alert and oriented x3 in no acute distress.  Dermatology: Skin is warm, dry and supple bilateral lower extremities.   Vascular: Palpable pedal pulses bilaterally. Capillary refill within normal limits.  Moderate edema.  No erythema  Neurological: Grossly intact via light touch  Musculoskeletal Exam: Tenderness along the lateral aspect of the left ankle  Radiographic Exam LT ankle 08/07/2024:  Normal osseous mineralization. Joint spaces preserved.  Very subtle closed nondisplaced avulsion type fracture noted to the very distal tip of the fibula  Assessment/Plan of Care: 1.  Subtle avulsion type fracture to the distal fibula; closed, nondisplaced, initial encounter.  DOI: 07/31/2024  -Patient evaluated.  X-rays reviewed -Recommend WBAT CAM boot x 4 weeks -RICE -Return to clinic 4 weeks follow-up x-ray       Thresa EMERSON Sar, DPM [facility]  Dr. Thresa EMERSON Sar, DPM    [address] [place], [place] [address]                Office [phone]  Fax [phone]

## 2024-08-23 ENCOUNTER — Ambulatory Visit
Admission: RE | Admit: 2024-08-23 | Discharge: 2024-08-23 | Disposition: A | Discharge status: Home / Self Care | Source: Ambulatory Visit | Attending: Internal Medicine | Admitting: Internal Medicine

## 2024-08-23 DIAGNOSIS — Z1231 Encounter for screening mammogram for malignant neoplasm of breast: Secondary | ICD-10-CM | POA: Insufficient documentation

## 2024-08-29 ENCOUNTER — Ambulatory Visit (INDEPENDENT_AMBULATORY_CARE_PROVIDER_SITE_OTHER)

## 2024-08-29 ENCOUNTER — Encounter: Payer: Self-pay | Admitting: Podiatry

## 2024-08-29 ENCOUNTER — Ambulatory Visit (INDEPENDENT_AMBULATORY_CARE_PROVIDER_SITE_OTHER): Admitting: Podiatry

## 2024-08-29 DIAGNOSIS — S82832D Other fracture of upper and lower end of left fibula, subsequent encounter for closed fracture with routine healing: Secondary | ICD-10-CM | POA: Diagnosis not present

## 2024-08-29 DIAGNOSIS — S82832A Other fracture of upper and lower end of left fibula, initial encounter for closed fracture: Secondary | ICD-10-CM | POA: Diagnosis not present

## 2024-08-29 DIAGNOSIS — S82832E Other fracture of upper and lower end of left fibula, subsequent encounter for open fracture type I or II with routine healing: Secondary | ICD-10-CM | POA: Diagnosis not present

## 2024-08-29 NOTE — Progress Notes (Signed)
 Patient presents status post lateral ankle fracture with avulsion fracture of the distal fibula left.  Doing better with decreased pain..  Still some swelling.   Physical exam:  General appearance: Pleasant, and in no acute distress. AOx3.  Vascular: Pedal pulses: DP 2/4 bilaterally, PT 2/4 bilaterally.  Mild edema lateral ankle left.  Neurological: Grossly intact bilaterally   Dermatologic:   Skin normal temperature bilaterally.  Skin normal color, tone, and texture bilaterally.   Musculoskeletal: Some tenderness of the distal fibula left.  No tenderness with range of motion of the ankle or subtalar joint.  Radiographs: 3 views ankle left: Good healing at small avulsion fracture distal fibula.  No displacement noted.  Normal ankle joint space left.  Diagnosis: 1.  Closed lateral ankle fracture left, routine healing, subsequent visit  Plan: -S/p visit for evaluation management.  Level 3. - Appears to be healing well continuing wearing the pneumatic cast for weightbearing.  Elevate and ice as needed.  We will probably do another 4 weeks of the pneumatic cast.   Return 4 weeks follow-up ankle fracture left.  Radiographs 3 views left

## 2024-09-26 ENCOUNTER — Ambulatory Visit (INDEPENDENT_AMBULATORY_CARE_PROVIDER_SITE_OTHER)

## 2024-09-26 ENCOUNTER — Ambulatory Visit (INDEPENDENT_AMBULATORY_CARE_PROVIDER_SITE_OTHER): Admitting: Podiatry

## 2024-09-26 DIAGNOSIS — S82832D Other fracture of upper and lower end of left fibula, subsequent encounter for closed fracture with routine healing: Secondary | ICD-10-CM

## 2024-09-26 NOTE — Progress Notes (Signed)
 Patient presents follow-up fracture avulsion distal fibula left.  Doing well.  Has occasional intermittent aches.  A little bit of swelling at times also.   Physical exam:  General appearance: Pleasant, and in no acute distress. AOx3.  Vascular: Pedal pulses: DP 2/4 bilaterally, PT 2/4 bilaterally. Decreased edema lower leg left. Capillary fill time immediate b/l.  Neurological: Grossly intact bilaterally  Dermatologic:   Skin normal temperature bilaterally.  Skin normal color, tone, and texture bilaterally.   Musculoskeletal: Minimal soreness at fracture site.  Some tenderness along the peroneal tendons.  Normal muscle strength lower extremity left.  Radiographs: 3 views ankle left: Healing at fracture site with good good bony consolidation.  No displacement noted.  Good alignment.  Diagnosis: 1. Closed lateral ankle fracture left, routine healing, subsequent visit  Plan: -Established office visit for evaluation and management level 3 -Healing well return to regular shoe and increase activity as tolerated. - Elevate and ice as needed  Return as needed

## 2024-10-05 ENCOUNTER — Ambulatory Visit
Admission: RE | Admit: 2024-10-05 | Discharge: 2024-10-05 | Disposition: A | Discharge status: Home / Self Care | Source: Ambulatory Visit | Attending: Internal Medicine | Admitting: Internal Medicine

## 2024-10-05 DIAGNOSIS — Z1231 Encounter for screening mammogram for malignant neoplasm of breast: Secondary | ICD-10-CM | POA: Diagnosis present

## 2024-11-06 ENCOUNTER — Ambulatory Visit: Admitting: Obstetrics & Gynecology

## 2024-12-04 ENCOUNTER — Ambulatory Visit: Admitting: Obstetrics & Gynecology
